# Patient Record
Sex: Male | Born: 1965 | Marital: Single | Smoking: Never smoker
Health system: Southern US, Community
[De-identification: ages and names within clinical notes are randomized; demographics above are authoritative.]

---

## 2016-04-16 ENCOUNTER — Encounter: Payer: Self-pay | Admitting: Sports Medicine

## 2016-04-16 ENCOUNTER — Ambulatory Visit (INDEPENDENT_AMBULATORY_CARE_PROVIDER_SITE_OTHER): Payer: No Typology Code available for payment source | Admitting: Sports Medicine

## 2016-04-16 DIAGNOSIS — M2142 Flat foot [pes planus] (acquired), left foot: Secondary | ICD-10-CM | POA: Diagnosis not present

## 2016-04-16 DIAGNOSIS — M79671 Pain in right foot: Secondary | ICD-10-CM | POA: Diagnosis not present

## 2016-04-16 DIAGNOSIS — M2141 Flat foot [pes planus] (acquired), right foot: Secondary | ICD-10-CM | POA: Diagnosis not present

## 2016-04-16 DIAGNOSIS — M729 Fibroblastic disorder, unspecified: Secondary | ICD-10-CM | POA: Diagnosis not present

## 2016-04-16 DIAGNOSIS — M79672 Pain in left foot: Secondary | ICD-10-CM | POA: Diagnosis not present

## 2016-04-16 DIAGNOSIS — M21619 Bunion of unspecified foot: Secondary | ICD-10-CM

## 2016-04-16 DIAGNOSIS — M779 Enthesopathy, unspecified: Secondary | ICD-10-CM | POA: Diagnosis not present

## 2016-04-16 NOTE — Progress Notes (Signed)
Subjective: Bobby Herrera is a 50 y.o. male patient who presents to office for evaluation for orthotics; Patient denies current pain but in past had knee and hip issues and was given orthotics in the past for fasciitis and flatfeet. Patient desires new orthotics current ones are old and worn and no longer offers much support. Patient is an avid runner 27 miles per week. Patient denies any other pedal complaints.   There are no active problems to display for this patient.   No current outpatient prescriptions on file prior to visit.   No current facility-administered medications on file prior to visit.     No Known Allergies   Objective:  General: Alert and oriented x3 in no acute distress  Dermatology: No open lesions bilateral lower extremities, no webspace macerations, no ecchymosis bilateral, all nails x 10 are well manicured.  Vascular: Dorsalis Pedis and Posterior Tibial pedal pulses 2/4, Capillary Fill Time 3 seconds, (+) pedal hair growth bilateral, no edema bilateral lower extremities, Temperature gradient within normal limits.  Neurology: Michaell CowingGross sensation intact via light touch bilateral.  Musculoskeletal: No reproducible tenderness with palpation along medial arch, medial fascial band on or along Posterior tibial tendon course with medial soft tissue buldge noted, there is decreased ankle rom with knee extending  vs flexed resembling gastroc equnius bilateral, Subtalar joint range of motion is within normal limits, there is no 1st ray hypermobility noted bilateral, decreased 1st MPJ rom Right>Left with functional limitus and bunion noted on weightbearing exam, there is medial arch collapse Right> Left on weightbearing exam,slight RF valgus Right> Left, no "too-many toes" sign appreciated, able to perform heel rise test with no pain in right>left arch.   Current orthotics worn and no longer offering full biomechanical support  Biomechanical exam: See below       Assessment  and Plan: Problem List Items Addressed This Visit    None    Visit Diagnoses    Fasciitis    -  Primary   Pes planus of both feet       Tendonitis       Bunion       Foot pain, bilateral         -Complete  examination performed -Discussed treatement options; discussed bunion/ fasciitis/tendonitis/pes planus deformity;conservative and  Surgical management; risks, benefits, alternatives discussed. All questions answered.  -Rx [facility] orthotics [place] lab full length polypro firm, pelite, deep heel cup -Recommend good supportive shoes and replacing running shoes 300-500 miles -Patient to return to office for orthotic dispensing or sooner if condition worsens.  Asencion Islamitorya Naren Benally, DPM   BIOMECHANICAL EXAMINATION FORM  MUSCULOSKELETAL EVALUATION  Normal Non-Wt Bearing Assessment R  L  Quality of Motion (circle) R  L  Muscle Strength (0-5/5) R  L  45o Internal Hip Rotation (ext)  normal Ankle (dorsiflexion)  Hip Flexors  45o External Hip Rotation (ext) normal Normal Limited Painful Hip Extensors  0o Neutral Position of Hip (ext) normal Ankle (plantarflexion)  Hip Abductors  15-20o Malleolar Position (ext) normal  Normal Limited Painful Hip Adductors  10o Ankle DF (Knee Extended) 0 STJ (supination) Hip Rotators (Internal)   >10o Ankle DF (Knee Flexed) +2  Normal Limited Painful Hip Rotators (External)   20o Heel Inversion 15 STJ (pronation) Gastrocnemius  10o Heel Eversion 15 Normal Limited Painful Soleus  0o STJ Neutral Position 2 everted Hallux (dorsiflexion) Tib. Posterior  perp Forefoot to Rearfoot (1-5) perp Normal Limited Painful Flex. Hallucis Longus  perp Forefoot to Rearfoot (2-5) perp Hallux (  plantarflexion) Flex. Digitorum Longus  5mm First Ray Dorsiflexion normal Normal Limited Painful Flex. Digitorum Brevis  5mm First Ray Plantarflexion normal Lesser Digits (dorsiflexion) Tib. Anterior  0mm First Ray Neutral Position normal Normal Limited Painful Ext. Digitorum Longus  65o  Hallux Dorsiflexion 55 Lesser Digits (plantarflexion) Ext. Hallucis Longus  >30o Hallux Plantarflexion 30 Normal Limited Painful Ext. Digitorum Brevis  Peroneus Longus  FOOT MORPHOLOGY  Peroneus Brevis  Frontal Plane (circle) Sagittal Plane R L   Normal morphology R L Normal morphology  Varus Valgus Anterior Cavus Transverse Plane R L  Forefoot R L R L Posterior Cavus Normal morphology  Rearfoot R L R L Cavoadductovarus Forefoot Adducted  Calcaneovalgus R>L 4 deg evered  Forefoot Abducted  ANKLE MORPHOLOGY R  L  Planovalgus Rearfoot Adducted  Normal morphology Rocker Bottom  Equinus Bilateral  Other________________  Calcaneus DIGITAL ASSESSMENT (circle)  Varum Limb Length Inequality (in cm) None R L R: 1 2 3 4 5  L: 1 2 3 4 5  Abducted   Valgum 4 Normal (symmetric) R: 1 2 3 4 5  L: 1 2 3 4 5  Adducted   Other________________ Structural R: 1 2 3 4 5  L: 1 2 3 4 5  Claw toe   Combined R: 1 2 3 4 5  L: 1 2 3 4 5  Hammer toe   POSTURAL APPRAISAL (circle) Functional Mallet toe   Head Position:  Hallux IP Extensus R L   Forward Backward Sideward GAIT ANALYSIS (Barefoot Gait Pattern)  Hallux IP Abductus R L  Shoulders: (circle)   Level R L Normal Antalgic Apropulsive  IF A PORTION OF EXAM IS DEFERRED,   Dropped  Other (e.g. Steppage, Circumducted, Scissor) GIVE REASON:   Forward  R L   Backward  Angle of Gait  Spine: None Base of Gait   Scoliosis   Lordosis  Patellar Position:   Kyphosis  Contact ASSESSMENT:  Pelvis:  Level Mid-Stance   Level  Propulsion   Dropped  Swing   Forward   Backward  Heel Position:   Contact  Patella Orientation (circle):  central Mid-Stance  Medial Central Lateral  Propulsion  R L R L R L  Swing  Knee Varum Valgum Flexion Recurvatum  R L R L R L R L Heel Off (circle): Increased  TREATMENT PLAN:  Tibia frontal  Varum Valgum  WNL Early  R L R L  Abductory Twist (circle):  none Malleolar Position Internal External  R L R L  Yes No; NO  R L  Neutral Calc. Stance  Position (deg.)  0 deg  Relaxed Calc. Stance Position (deg.) 2 deg

## 2016-05-07 ENCOUNTER — Ambulatory Visit (INDEPENDENT_AMBULATORY_CARE_PROVIDER_SITE_OTHER): Payer: No Typology Code available for payment source

## 2016-05-07 DIAGNOSIS — M779 Enthesopathy, unspecified: Secondary | ICD-10-CM

## 2016-05-07 DIAGNOSIS — M729 Fibroblastic disorder, unspecified: Secondary | ICD-10-CM

## 2016-05-07 NOTE — Patient Instructions (Signed)

## 2016-05-07 NOTE — Progress Notes (Signed)
Patient presents for orthotic pick up.  Verbal and written break in and wear instructions given.  Patient will follow up in 4 weeks if symptoms worsen or fail to improve. 

## 2020-07-12 ENCOUNTER — Encounter (HOSPITAL_BASED_OUTPATIENT_CLINIC_OR_DEPARTMENT_OTHER): Payer: Self-pay

## 2020-07-12 ENCOUNTER — Other Ambulatory Visit: Payer: Self-pay

## 2020-07-12 DIAGNOSIS — M7989 Other specified soft tissue disorders: Secondary | ICD-10-CM | POA: Diagnosis present

## 2020-07-12 DIAGNOSIS — I82401 Acute embolism and thrombosis of unspecified deep veins of right lower extremity: Secondary | ICD-10-CM | POA: Insufficient documentation

## 2020-07-12 DIAGNOSIS — R0989 Other specified symptoms and signs involving the circulatory and respiratory systems: Secondary | ICD-10-CM | POA: Diagnosis not present

## 2020-07-12 NOTE — ED Triage Notes (Addendum)
Pt c/o right LE swelling from knee to foot-denies injury-NAD-steady gait-sent from [place] walk in clinic with notes for need for [place]

## 2020-07-12 NOTE — ED Notes (Signed)
Explained to pt no [place] available at this time and that he will need to wait to see EDP for possibility of order for outpt [place] schedule for tomorrow-agreeable

## 2020-07-13 ENCOUNTER — Emergency Department (HOSPITAL_BASED_OUTPATIENT_CLINIC_OR_DEPARTMENT_OTHER)
Admit: 2020-07-13 | Discharge: 2020-07-13 | Disposition: A | Discharge status: Home / Self Care | Payer: No Typology Code available for payment source | Attending: Emergency Medicine | Admitting: Emergency Medicine

## 2020-07-13 ENCOUNTER — Emergency Department (HOSPITAL_BASED_OUTPATIENT_CLINIC_OR_DEPARTMENT_OTHER)
Admission: EM | Admit: 2020-07-13 | Discharge: 2020-07-13 | Disposition: A | Discharge status: Home / Self Care | Payer: No Typology Code available for payment source | Source: Home / Self Care | Attending: Emergency Medicine | Admitting: Emergency Medicine

## 2020-07-13 ENCOUNTER — Encounter (HOSPITAL_BASED_OUTPATIENT_CLINIC_OR_DEPARTMENT_OTHER): Payer: Self-pay

## 2020-07-13 ENCOUNTER — Emergency Department (HOSPITAL_BASED_OUTPATIENT_CLINIC_OR_DEPARTMENT_OTHER)
Admission: EM | Admit: 2020-07-13 | Discharge: 2020-07-13 | Disposition: A | Discharge status: Home / Self Care | Payer: No Typology Code available for payment source | Attending: Emergency Medicine | Admitting: Emergency Medicine

## 2020-07-13 ENCOUNTER — Other Ambulatory Visit: Payer: Self-pay

## 2020-07-13 DIAGNOSIS — I82401 Acute embolism and thrombosis of unspecified deep veins of right lower extremity: Secondary | ICD-10-CM | POA: Insufficient documentation

## 2020-07-13 DIAGNOSIS — R0989 Other specified symptoms and signs involving the circulatory and respiratory systems: Secondary | ICD-10-CM

## 2020-07-13 DIAGNOSIS — I824Y1 Acute embolism and thrombosis of unspecified deep veins of right proximal lower extremity: Secondary | ICD-10-CM

## 2020-07-13 LAB — BASIC METABOLIC PANEL
Anion gap: 11 (ref 5–15)
BUN: 15 mg/dL (ref 6–20)
CO2: 25 mmol/L (ref 22–32)
Calcium: 9.1 mg/dL (ref 8.9–10.3)
Chloride: 99 mmol/L (ref 98–111)
Creatinine, Ser: 0.88 mg/dL (ref 0.61–1.24)
GFR, Estimated: >60 mL/min (ref >60)
Glucose, Bld: 96 mg/dL (ref 70–99)
Potassium: 3.7 mmol/L (ref 3.5–5.1)
Sodium: 135 mmol/L (ref 135–145)

## 2020-07-13 LAB — CBC WITH DIFFERENTIAL/PLATELET
Abs Immature Granulocytes: 0.01 10*3/uL (ref 0.00–0.07)
Basophils Absolute: 0 10*3/uL (ref 0.0–0.1)
Basophils Relative: 0 %
Eosinophils Absolute: 0.1 10*3/uL (ref 0.0–0.5)
Eosinophils Relative: 2 %
HCT: 42.7 % (ref 39.0–52.0)
Hemoglobin: 13.9 g/dL (ref 13.0–17.0)
Immature Granulocytes: 0 %
Lymphocytes Relative: 28 %
Lymphs Abs: 1.3 10*3/uL (ref 0.7–4.0)
MCH: 30 pg (ref 26.0–34.0)
MCHC: 32.6 g/dL (ref 30.0–36.0)
MCV: 92.2 fL (ref 80.0–100.0)
Monocytes Absolute: 0.6 10*3/uL (ref 0.1–1.0)
Monocytes Relative: 11 %
Neutro Abs: 2.9 10*3/uL (ref 1.7–7.7)
Neutrophils Relative %: 59 %
Platelets: 122 10*3/uL — ABNORMAL LOW (ref 150–400)
RBC: 4.63 MIL/uL (ref 4.22–5.81)
RDW: 13.4 % (ref 11.5–15.5)
WBC: 4.9 10*3/uL (ref 4.0–10.5)
nRBC: 0 % (ref 0.0–0.2)

## 2020-07-13 MED ORDER — APIXABAN (ELIQUIS) VTE STARTER PACK (10MG AND 5MG): ORAL_TABLET | ORAL | 0 refills | Status: DC

## 2020-07-13 MED ORDER — ENOXAPARIN SODIUM 80 MG/0.8ML ~~LOC~~ SOLN
1.0000 mg/kg | Freq: Once | SUBCUTANEOUS | Status: AC
  Administered 2020-07-13: 75 mg via SUBCUTANEOUS
  Filled 2020-07-13: qty 0.8

## 2020-07-13 NOTE — ED Triage Notes (Signed)
Pt returned for outpt [place] with +DVT right LE-NAD-stead gait

## 2020-07-13 NOTE — ED Notes (Signed)
Scheduled pt for [place] today @ 1300, pt verbalized understanding to return to [facility] around this time for exam. Vitals WNL, ambulated out of ED w/o issue.

## 2020-07-13 NOTE — ED Provider Notes (Signed)
[facility] EMERGENCY DEPARTMENT Provider Note  CSN: [id number] Arrival date & time: 07/13/20 1403    History No chief complaint on file.   HPI  Bobby Herrera is a 55 y.o. male seen yesterday for RLE swelling. Returned today for outpatient doppler which was positive for DVT. Has not had any recent labs in our system or at PCP office. Denies any CP or SOB.    History reviewed. No pertinent past medical history.  History reviewed. No pertinent surgical history.  No family history on file.  Social History   Tobacco Use  . Smoking status: Never Smoker  . Smokeless tobacco: Never Used  Substance Use Topics  . Alcohol use: Never  . Drug use: Never     Home Medications Prior to Admission medications   Not on File     Allergies    Patient has no known allergies.   Review of Systems   Review of Systems A comprehensive review of systems was completed and negative except as noted in HPI.     Physical Exam BP (!) 153/105 (BP Location: Right Arm)   Pulse 67   Temp 98.4 F (36.9 C) (Oral)   Resp 18   Wt 76.2 kg   SpO2 100%   BMI 22.78 kg/m   Physical Exam Vitals and nursing note reviewed.  HENT:     Head: Normocephalic.     Nose: Nose normal.  Eyes:     Extraocular Movements: Extraocular movements intact.  Pulmonary:     Effort: Pulmonary effort is normal.  Musculoskeletal:        General: Normal range of motion.     Cervical back: Neck supple.     Right lower leg: Edema present.     Left lower leg: No edema.  Skin:    Findings: No rash (on exposed skin).  Neurological:     Mental Status: He is alert and oriented to person, place, and time.  Psychiatric:        Mood and Affect: Mood normal.      ED Results / Procedures / Treatments   Labs (all labs ordered are listed, but only abnormal results are displayed) Labs Reviewed  BASIC METABOLIC PANEL  CBC WITH DIFFERENTIAL/PLATELET    EKG None   Radiology US Venous Img Lower  Unilateral Right  Result Date: 07/13/2020 CLINICAL DATA:  Right lower extremity pain and swelling for 3 days. EXAM: RIGHT LOWER EXTREMITY VENOUS DOPPLER ULTRASOUND TECHNIQUE: Gray-scale sonography with compression, as well as color and duplex ultrasound, were performed to evaluate the deep venous system(s) from the level of the common femoral vein through the popliteal and proximal calf veins. COMPARISON:  None. FINDINGS: VENOUS Occlusive thrombus is seen in the right common femoral and profundus femoral veins as well as at the saphenofemoral junction. Limited views of the contralateral common femoral vein are unremarkable. OTHER None. Limitations: None. IMPRESSION: Positive for acute DVT as described above. Critical Value/emergent results were called by telephone at the time of interpretation on 07/13/2020 at 1:30 pm to provider [facility] , who verbally acknowledged these results. Electronically Signed   By: Drusilla Kanner M.D.   On: 07/13/2020 13:35    Procedures Procedures  Medications Ordered in the ED Medications - No data to display   MDM Rules/Calculators/A&P MDM Doppler is positive for DVT. Unfortunately no labs available to ensure creatinine clearance is sufficient for oral anticoagulation. Will check labs and plan discharge if unremarkable.  ED Course  I have  reviewed the triage vital signs and the nursing notes.  Pertinent labs & imaging results that were available during my care of the patient were reviewed by me and considered in my medical decision making (see chart for details).     Final Clinical Impression(s) / ED Diagnoses Final diagnoses:  Acute deep vein thrombosis (DVT) of proximal vein of right lower extremity [facility])    Rx / DC Orders ED Discharge Orders    None       Pollyann Savoy, MD 07/13/20 1457

## 2020-07-13 NOTE — ED Provider Notes (Signed)
   [facility] Provider Note: Lowella Dell, MD, FACEP  CSN: [id number] MRN: [id number] ARRIVAL: 07/12/20 at 2157 ROOM: MH08/MH08   CHIEF COMPLAINT  Leg Swelling   HISTORY OF PRESENT ILLNESS  07/13/20 2:33 AM Bobby Herrera is a 55 y.o. male with 3 days of swelling and discomfort in his right lower leg from the knee down.  He is not aware of anything that triggered this.  He has not been on recent travel.  He has no known malignancy.  Symptoms are moderate. He denies chest pain or shortness of breath.  He was seen in an [place] walk-in clinic yesterday and they advised to come to the ED for a Doppler ultrasound.   History reviewed. No pertinent past medical history.  History reviewed. No pertinent surgical history.  No family history on file.  Social History   Tobacco Use  . Smoking status: Never Smoker  . Smokeless tobacco: Never Used  Substance Use Topics  . Alcohol use: Never  . Drug use: Never    Prior to Admission medications   Not on File    Allergies Patient has no known allergies.   REVIEW OF SYSTEMS  Negative except as noted here or in the History of Present Illness.   PHYSICAL EXAMINATION  Initial Vital Signs Blood pressure 125/78, pulse (!) 56, temperature 98.8 F (37.1 C), temperature source Oral, resp. rate 18, height 6' (1.829 m), weight 74.8 kg, SpO2 100 %.  Examination General: Well-developed, well-nourished male in no acute distress; appearance consistent with age of record HENT: normocephalic; atraumatic Eyes: Normal appearance Neck: supple Heart: regular rate and rhythm Lungs: clear to auscultation bilaterally Abdomen: soft; nondistended; nontender; bowel sounds present Extremities: No deformity; full range of motion; edema and mild tenderness of right lower leg without erythema or warmth Neurologic: Awake, alert and oriented; motor function intact in all extremities and symmetric; no facial droop Skin: Warm and dry Psychiatric:  Normal mood and affect   RESULTS  Summary of this visit's results, reviewed and interpreted by myself:   EKG Interpretation  Date/Time:    Ventricular Rate:    PR Interval:    QRS Duration:   QT Interval:    QTC Calculation:   R Axis:     Text Interpretation:        Laboratory Studies: No results found for this or any previous visit (from the past 24 hour(s)). Imaging Studies: No results found.  ED COURSE and MDM  Nursing notes, initial and subsequent vitals signs, including pulse oximetry, reviewed and interpreted by myself.  Vitals:   07/12/20 2212 07/13/20 0030  BP: (!) 147/92 125/78  Pulse: 66 (!) 56  Resp: 18 18  Temp: 98.4 F (36.9 C) 98.8 F (37.1 C)  TempSrc: Oral Oral  SpO2: 100% 100%  Weight: 74.8 kg   Height: 6' (1.829 m)    Medications  enoxaparin (LOVENOX) injection 75 mg (has no administration in time range)    Patient's symptoms and presentation are concerning for deep vein thrombosis of the right leg.  We will give Lovenox and have him return later today for Doppler ultrasound.  PROCEDURES  Procedures   ED DIAGNOSES     ICD-10-CM   1. Suspected DVT (deep vein thrombosis)  R09.89        Paula Libra, MD 07/13/20 [phone]

## 2020-07-25 ENCOUNTER — Telehealth: Payer: Self-pay | Admitting: Hematology and Oncology

## 2020-07-25 NOTE — Telephone Encounter (Signed)
Received a new hem referral from Dr. Kateri Plummer for dvt. Mr. Bobby Herrera has been cld and scheduled to see Dr. Leonides Schanz on 2/4 at 2pm. Pt aware to arrive 20 minutes early.

## 2020-08-04 ENCOUNTER — Inpatient Hospital Stay: Payer: No Typology Code available for payment source | Attending: Hematology and Oncology

## 2020-08-04 ENCOUNTER — Other Ambulatory Visit: Payer: Self-pay

## 2020-08-04 ENCOUNTER — Inpatient Hospital Stay (HOSPITAL_BASED_OUTPATIENT_CLINIC_OR_DEPARTMENT_OTHER): Payer: No Typology Code available for payment source | Admitting: Hematology and Oncology

## 2020-08-04 VITALS — BP 124/86 | HR 71 | Temp 97.6°F | Resp 18 | Ht 72.0 in | Wt 170.2 lb

## 2020-08-04 DIAGNOSIS — Z7901 Long term (current) use of anticoagulants: Secondary | ICD-10-CM | POA: Diagnosis not present

## 2020-08-04 DIAGNOSIS — I82411 Acute embolism and thrombosis of right femoral vein: Secondary | ICD-10-CM | POA: Insufficient documentation

## 2020-08-04 LAB — CBC WITH DIFFERENTIAL (CANCER CENTER ONLY)
Abs Immature Granulocytes: 0 10*3/uL (ref 0.00–0.07)
Basophils Absolute: 0 10*3/uL (ref 0.0–0.1)
Basophils Relative: 0 %
Eosinophils Absolute: 0.1 10*3/uL (ref 0.0–0.5)
Eosinophils Relative: 2 %
HCT: 42.2 % (ref 39.0–52.0)
Hemoglobin: 13.6 g/dL (ref 13.0–17.0)
Immature Granulocytes: 0 %
Lymphocytes Relative: 48 %
Lymphs Abs: 1.8 10*3/uL (ref 0.7–4.0)
MCH: 29.6 pg (ref 26.0–34.0)
MCHC: 32.2 g/dL (ref 30.0–36.0)
MCV: 91.9 fL (ref 80.0–100.0)
Monocytes Absolute: 0.4 10*3/uL (ref 0.1–1.0)
Monocytes Relative: 11 %
Neutro Abs: 1.4 10*3/uL — ABNORMAL LOW (ref 1.7–7.7)
Neutrophils Relative %: 39 %
Platelet Count: 154 10*3/uL (ref 150–400)
RBC: 4.59 MIL/uL (ref 4.22–5.81)
RDW: 13.5 % (ref 11.5–15.5)
WBC Count: 3.7 10*3/uL — ABNORMAL LOW (ref 4.0–10.5)
nRBC: 0 % (ref 0.0–0.2)

## 2020-08-04 LAB — CMP (CANCER CENTER ONLY)
ALT: 20 U/L (ref 0–44)
AST: 21 U/L (ref 15–41)
Albumin: 4 g/dL (ref 3.5–5.0)
Alkaline Phosphatase: 84 U/L (ref 38–126)
Anion gap: 7 (ref 5–15)
BUN: 12 mg/dL (ref 6–20)
CO2: 27 mmol/L (ref 22–32)
Calcium: 9.3 mg/dL (ref 8.9–10.3)
Chloride: 104 mmol/L (ref 98–111)
Creatinine: 1.23 mg/dL (ref 0.61–1.24)
GFR, Estimated: >60 mL/min (ref >60)
Glucose, Bld: 97 mg/dL (ref 70–99)
Potassium: 4.5 mmol/L (ref 3.5–5.1)
Sodium: 138 mmol/L (ref 135–145)
Total Bilirubin: 0.4 mg/dL (ref 0.3–1.2)
Total Protein: 7.5 g/dL (ref 6.5–8.1)

## 2020-08-04 NOTE — Progress Notes (Signed)
[facility] [phone]   [phone]  INITIAL CONSULT NOTE  Patient Care Team: Patient, No Pcp Per as PCP - General (General Practice)  Hematological/Oncological History # Unprovoked DVT of Right Leg 07/13/2020: presented to ED with pain/swelling in RLE. [place] LE showed occlusive thrombus is seen in the right common femoral and profundus femoral veins as well as at the saphenofemoral junction. Started on Eliquis therapy 5mg  BID.  08/04/2020: establish care with Dr. 10/02/2020   CHIEF COMPLAINTS/PURPOSE OF CONSULTATION:  "DVT of Right Leg "  HISTORY OF PRESENTING ILLNESS:  Bobby Herrera 55 y.o. male with no remarkable medical history who presents for evaluation of an unprovoked right lower extremity DVT.  On review of the previous records Bobby Herrera presented the emergency department on 07/13/2020 with pain and swelling in his right lower extremity.  He had an ultrasound performed which showed an occlusive thrombus in the right common femoral and profunda's femoral veins as well as the saphenofemoral junction.  He started on Eliquis therapy 5 mg twice daily.  Due to concern for this patient's unprovoked DVT he was referred to hematology for further evaluation and management.  On exam today Bobby Herrera notes that his symptoms originally began 3 to 4 days before he presented to an urgent care around January 12.  When they suspected DVT they sent him to the emergency department where he had the ultrasound performed which confirmed the diagnosis.  He reports he has been tolerating Eliquis therapy well and is not been having any issues with bleeding, bruising, or dark stools.  He also notes that there has been a decrease in the swelling and decrease in pain in the lower extremity that was affected by the DVT.  On further discussion he notes that he has not had any provoking factors to cause this DVT such as long car ride, prolonged immobilization, or recent surgery.  He  notes that he does not keep up with his hydration, and thinks dehydration may have been a contributing factor.  In regards to his family history he reports that his father had a sudden death of unclear etiology, though pulmonary embolism was on the differential.  No autopsy was performed.  He reports that his mother is healthy and he does not have any children.  He currently works for the [employer]  He otherwise denies any fevers, chills, sweats, nausea, vomiting or diarrhea.  He notes that his cancer screenings are currently up-to-date.  A full 10 point ROS is listed below.  MEDICAL HISTORY:  History reviewed. No pertinent past medical history.  SURGICAL HISTORY: History reviewed. No pertinent surgical history.  SOCIAL HISTORY: Social History   Socioeconomic History  . Marital status: Single    Spouse name: Not on file  . Number of children: 0  . Years of education: Not on file  . Highest education level: Not on file  Occupational History  . Not on file  Tobacco Use  . Smoking status: Never Smoker  . Smokeless tobacco: Never Used  Substance and Sexual Activity  . Alcohol use: Never  . Drug use: Never  . Sexual activity: Not on file  Other Topics Concern  . Not on file  Social History Narrative  . Not on file   Social Determinants of Health   Financial Resource Strain: Not on file  Food Insecurity: Not on file  Transportation Needs: Not on file  Physical Activity: Not on file  Stress: Not on file  Social Connections:  Not on file  Intimate Partner Violence: Not on file    FAMILY HISTORY: Family History  Problem Relation Age of Onset  . Healthy Mother   . Pulmonary embolism Father     ALLERGIES:  has No Known Allergies.  MEDICATIONS:  Current Outpatient Medications  Medication Sig Dispense Refill  . apixaban (ELIQUIS) 5 MG TABS tablet Take 1 tablet (5 mg total) by mouth 2 (two) times daily. 60 tablet 3   No current facility-administered medications  for this visit.    REVIEW OF SYSTEMS:   Constitutional: ( - ) fevers, ( - )  chills , ( - ) night sweats Eyes: ( - ) blurriness of vision, ( - ) double vision, ( - ) watery eyes Ears, nose, mouth, throat, and face: ( - ) mucositis, ( - ) sore throat Respiratory: ( - ) cough, ( - ) dyspnea, ( - ) wheezes Cardiovascular: ( - ) palpitation, ( - ) chest discomfort, ( - ) lower extremity swelling Gastrointestinal:  ( - ) nausea, ( - ) heartburn, ( - ) change in bowel habits Skin: ( - ) abnormal skin rashes Lymphatics: ( - ) new lymphadenopathy, ( - ) easy bruising Neurological: ( - ) numbness, ( - ) tingling, ( - ) new weaknesses Behavioral/Psych: ( - ) mood change, ( - ) new changes  All other systems were reviewed with the patient and are negative.  PHYSICAL EXAMINATION:  Vitals:   08/04/20 1354  BP: 124/86  Pulse: 71  Resp: 18  Temp: 97.6 F (36.4 C)  SpO2: 99%   Filed Weights   08/04/20 1354  Weight: 170 lb 3.2 oz (77.2 kg)    GENERAL: well appearing middle aged [place] American male in NAD  SKIN: skin color, texture, turgor are normal, no rashes or significant lesions EYES: conjunctiva are pink and non-injected, sclera clear LUNGS: clear to auscultation and percussion with normal breathing effort HEART: regular rate & rhythm and no murmurs and no lower extremity edema Musculoskeletal: no cyanosis of digits and no clubbing  PSYCH: alert & oriented x 3, fluent speech NEURO: no focal motor/sensory deficits  LABORATORY DATA:  I have reviewed the data as listed CBC Latest Ref Rng & Units 08/04/2020 07/13/2020  WBC 4.0 - 10.5 K/uL 3.7(L) 4.9  Hemoglobin 13.0 - 17.0 g/dL 82.5 00.3  Hematocrit 70.4 - 52.0 % 42.2 42.7  Platelets 150 - 400 K/uL 154 122(L)    CMP Latest Ref Rng & Units 08/04/2020 07/13/2020  Glucose 70 - 99 mg/dL 97 96  BUN 6 - 20 mg/dL 12 15  Creatinine 8.88 - 1.24 mg/dL 9.16 9.45  Sodium 038 - 145 mmol/L 138 135  Potassium 3.5 - 5.1 mmol/L 4.5 3.7  Chloride  98 - 111 mmol/L 104 99  CO2 22 - 32 mmol/L 27 25  Calcium 8.9 - 10.3 mg/dL 9.3 9.1  Total Protein 6.5 - 8.1 g/dL 7.5 -  Total Bilirubin 0.3 - 1.2 mg/dL 0.4 -  Alkaline Phos 38 - 126 U/L 84 -  AST 15 - 41 U/L 21 -  ALT 0 - 44 U/L 20 -    RADIOGRAPHIC STUDIES: US Venous Img Lower Unilateral Right  Result Date: 07/13/2020 CLINICAL DATA:  Right lower extremity pain and swelling for 3 days. EXAM: RIGHT LOWER EXTREMITY VENOUS DOPPLER ULTRASOUND TECHNIQUE: Gray-scale sonography with compression, as well as color and duplex ultrasound, were performed to evaluate the deep venous system(s) from the level of the common femoral vein through the  popliteal and proximal calf veins. COMPARISON:  None. FINDINGS: VENOUS Occlusive thrombus is seen in the right common femoral and profundus femoral veins as well as at the saphenofemoral junction. Limited views of the contralateral common femoral vein are unremarkable. OTHER None. Limitations: None. IMPRESSION: Positive for acute DVT as described above. Critical Value/emergent results were called by telephone at the time of interpretation on 07/13/2020 at 1:30 pm to provider [facility] , who verbally acknowledged these results. Electronically Signed   By: Drusilla Kanner M.D.   On: 07/13/2020 13:35    ASSESSMENT & PLAN Bobby Herrera 55 y.o. male with no remarkable medical history who presents for evaluation of an unprovoked right lower extremity DVT.  I reviewed labs, reviewed records, schedule the patient the findings most consistent with an unprovoked right lower extremity DVT.  Given these findings I would recommend that we proceed with indefinite anticoagulation.  He is tolerating Eliquis therapy well and I think this is a reasonable option for him.  Today we will order baseline labs in order to assure that he is appropriate kidney function and liver function for this therapy.  Additionally we will order antiphospholipid antibody in order to assure that he is  on the appropriate anticoagulation therapy.  We will plan to see him back in approximate 3 months time for continued discussion regarding anticoagulation therapy.  # Unprovoked DVT of Right Leg --Agree with continued Eliquis 5 mg twice daily.  The duration of therapy is indefinite given the unprovoked nature of his DVT. --We will order baseline CBC and CMP today. --We will order antiphospholipid antibodies in order to assure that the patient is on the correct anticoagulant therapy.  If he does have antiphospholipid antibody syndrome he would require Coumadin therapy instead of Eliquis.  Any other hypercoagulable condition would not alter management. --After 6 months of therapy can consider de-escalation down to maintenance dose Eliquis at 2.5 mg twice daily.  If there are financial issues or if the patient does not wish to continue anticoagulation therapy we could consider aspirin 81 mg daily instead. --Return to clinic in 3 months time in order to reevaluate.  Orders Placed This Encounter  Procedures  . CBC with Differential (Cancer Center Only)    Standing Status:   Future    Number of Occurrences:   1    Standing Expiration Date:   08/04/2021  . CMP (Cancer Center only)    Standing Status:   Future    Number of Occurrences:   1    Standing Expiration Date:   08/04/2021  . Cardiolipin antibodies, IgG, IgM, IgA*    Standing Status:   Future    Number of Occurrences:   1    Standing Expiration Date:   08/04/2021  . Beta-2-glycoprotein i abs, IgG/M/A    Standing Status:   Future    Number of Occurrences:   1    Standing Expiration Date:   08/04/2021  . Lupus anticoagulant panel*    Standing Status:   Future    Number of Occurrences:   1    Standing Expiration Date:   08/04/2021    All questions were answered. The patient knows to call the clinic with any problems, questions or concerns.  A total of more than 60 minutes were spent on this encounter and over half of that time was spent on  counseling and coordination of care as outlined above.   Ulysees Barns, MD Department of Hematology/Oncology [facility] at  [facility] Phone: [phone] Pager: [phone] Email: [email or web address]  08/06/2020 5:01 PM

## 2020-08-05 LAB — LUPUS ANTICOAGULANT PANEL
DRVVT: 48 s — ABNORMAL HIGH (ref 0.0–47.0)
PTT Lupus Anticoagulant: 32 s (ref 0.0–51.9)

## 2020-08-05 LAB — BETA-2-GLYCOPROTEIN I ABS, IGG/M/A
Beta-2 Glyco I IgG: <9 GPI IgG units (ref 0–20)
Beta-2-Glycoprotein I IgA: <9 GPI IgA units (ref 0–25)
Beta-2-Glycoprotein I IgM: <9 GPI IgM units (ref 0–32)

## 2020-08-05 LAB — CARDIOLIPIN ANTIBODIES, IGG, IGM, IGA
Anticardiolipin IgA: <9 APL U/mL (ref 0–11)
Anticardiolipin IgG: <9 GPL U/mL (ref 0–14)
Anticardiolipin IgM: 11 MPL U/mL (ref 0–12)

## 2020-08-05 LAB — DRVVT MIX: dRVVT Mix: 41.4 s — ABNORMAL HIGH (ref 0.0–40.4)

## 2020-08-05 LAB — DRVVT CONFIRM: dRVVT Confirm: 1.2 ratio (ref 0.8–1.2)

## 2020-08-06 ENCOUNTER — Encounter: Payer: Self-pay | Admitting: Hematology and Oncology

## 2020-08-06 MED ORDER — APIXABAN 5 MG PO TABS: 5.0000 mg | ORAL_TABLET | Freq: Two times a day (BID) | ORAL | 3 refills | Status: DC

## 2020-08-07 ENCOUNTER — Telehealth: Payer: Self-pay | Admitting: Hematology and Oncology

## 2020-08-07 NOTE — Telephone Encounter (Signed)
Scheduled appointments per 2/7 los. Called patient, no answer and no voicemail. Mailed updated calendar to patient.

## 2020-08-16 ENCOUNTER — Telehealth: Payer: Self-pay | Admitting: *Deleted

## 2020-08-16 NOTE — Telephone Encounter (Signed)
TCT patient. No answer and no available vmailbox.

## 2020-08-16 NOTE — Telephone Encounter (Signed)
-----   Message from Jaci Standard, MD sent at 08/16/2020  8:20 AM EST ----- Please tell Bobby Herrera that his bloodwork showed no evidence of a blood clotting disorder. It is unclear what caused his blood clot. We will plan to follow up with him in approximately 3 months.  ----- Message ----- From: Interface, [facility] In [place] Sent: 08/04/2020   2:55 PM EST To: Jaci Standard, MD

## 2020-09-06 ENCOUNTER — Telehealth: Payer: Self-pay | Admitting: *Deleted

## 2020-09-06 NOTE — Telephone Encounter (Signed)
Received call from patient inquiring about his lab results from 08/04/20. Please review and advise.  Pt was also asking about a "risk assessment for DVT"  Is this something you are familiar with? Please advise

## 2020-09-08 ENCOUNTER — Telehealth: Payer: Self-pay | Admitting: Hematology and Oncology

## 2020-09-08 NOTE — Telephone Encounter (Signed)
Returned call to reschedule upcoming appointment per 3/9 schedule message. Left message to call back to reschedule.

## 2020-09-13 NOTE — Telephone Encounter (Signed)
Please let Bobby Herrera know that we found no evidence of a clotting disorder in our focused workup. Risk Assessment for DVT is used to determine when to ultrasound the leg when there is suspicion of a clot. Since he has a clot already this assessment does not apply to him.

## 2020-09-13 NOTE — Telephone Encounter (Signed)
TCT patient regarding lab results. No answer but was able to leave vm message for pt to call back to review his lab results.

## 2020-10-02 ENCOUNTER — Telehealth: Payer: Self-pay | Admitting: *Deleted

## 2020-10-02 NOTE — Telephone Encounter (Signed)
Received call from patient regarding his lab results.  Please advise

## 2020-10-02 NOTE — Telephone Encounter (Signed)
TCT patient after speaking with Dr. Leonides Schanz. Spoke with patient and advised that his labs did not reveal any clotting disorders.  Pt voiced understanding

## 2020-10-16 ENCOUNTER — Other Ambulatory Visit: Payer: No Typology Code available for payment source

## 2020-10-16 ENCOUNTER — Inpatient Hospital Stay (HOSPITAL_BASED_OUTPATIENT_CLINIC_OR_DEPARTMENT_OTHER): Payer: No Typology Code available for payment source | Admitting: Hematology and Oncology

## 2020-10-16 ENCOUNTER — Inpatient Hospital Stay: Payer: No Typology Code available for payment source | Attending: Hematology and Oncology

## 2020-10-16 ENCOUNTER — Other Ambulatory Visit: Payer: Self-pay | Admitting: Hematology and Oncology

## 2020-10-16 ENCOUNTER — Encounter: Payer: Self-pay | Admitting: Hematology and Oncology

## 2020-10-16 ENCOUNTER — Ambulatory Visit: Payer: No Typology Code available for payment source | Admitting: Hematology and Oncology

## 2020-10-16 ENCOUNTER — Other Ambulatory Visit: Payer: Self-pay

## 2020-10-16 VITALS — BP 117/76 | HR 58 | Temp 98.0°F | Resp 15 | Ht 72.0 in | Wt 174.2 lb

## 2020-10-16 DIAGNOSIS — I82411 Acute embolism and thrombosis of right femoral vein: Secondary | ICD-10-CM | POA: Diagnosis not present

## 2020-10-16 DIAGNOSIS — Z7901 Long term (current) use of anticoagulants: Secondary | ICD-10-CM | POA: Diagnosis not present

## 2020-10-16 LAB — CMP (CANCER CENTER ONLY)
ALT: 16 U/L (ref 0–44)
AST: 20 U/L (ref 15–41)
Albumin: 4 g/dL (ref 3.5–5.0)
Alkaline Phosphatase: 67 U/L (ref 38–126)
Anion gap: 8 (ref 5–15)
BUN: 12 mg/dL (ref 6–20)
CO2: 28 mmol/L (ref 22–32)
Calcium: 9.3 mg/dL (ref 8.9–10.3)
Chloride: 105 mmol/L (ref 98–111)
Creatinine: 1.1 mg/dL (ref 0.61–1.24)
GFR, Estimated: >60 mL/min (ref >60)
Glucose, Bld: 84 mg/dL (ref 70–99)
Potassium: 4.5 mmol/L (ref 3.5–5.1)
Sodium: 141 mmol/L (ref 135–145)
Total Bilirubin: 0.5 mg/dL (ref 0.3–1.2)
Total Protein: 7.3 g/dL (ref 6.5–8.1)

## 2020-10-16 LAB — CBC WITH DIFFERENTIAL (CANCER CENTER ONLY)
Abs Immature Granulocytes: 0.01 10*3/uL (ref 0.00–0.07)
Basophils Absolute: 0 10*3/uL (ref 0.0–0.1)
Basophils Relative: 0 %
Eosinophils Absolute: 0 10*3/uL (ref 0.0–0.5)
Eosinophils Relative: 1 %
HCT: 42.5 % (ref 39.0–52.0)
Hemoglobin: 13.7 g/dL (ref 13.0–17.0)
Immature Granulocytes: 0 %
Lymphocytes Relative: 51 %
Lymphs Abs: 1.8 10*3/uL (ref 0.7–4.0)
MCH: 29.9 pg (ref 26.0–34.0)
MCHC: 32.2 g/dL (ref 30.0–36.0)
MCV: 92.8 fL (ref 80.0–100.0)
Monocytes Absolute: 0.3 10*3/uL (ref 0.1–1.0)
Monocytes Relative: 10 %
Neutro Abs: 1.3 10*3/uL — ABNORMAL LOW (ref 1.7–7.7)
Neutrophils Relative %: 38 %
Platelet Count: 148 10*3/uL — ABNORMAL LOW (ref 150–400)
RBC: 4.58 MIL/uL (ref 4.22–5.81)
RDW: 13.8 % (ref 11.5–15.5)
WBC Count: 3.5 10*3/uL — ABNORMAL LOW (ref 4.0–10.5)
nRBC: 0 % (ref 0.0–0.2)

## 2020-10-16 NOTE — Progress Notes (Signed)
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 [place] LE showed occlusive thrombus is seen in the right common femoral and profundus femoral veins as well as at the saphenofemoral junction. Started on Eliquis therapy 5mg  BID.  08/04/2020: establish care with Dr. 10/02/2020   Interval History:  Bobby Herrera 55 y.o. male with medical history significant for an unprovoked RLE DVT who presents for a follow up visit. The patient's last visit was on 08/04/2020. In the interim since the last visit he is continued on his Eliquis therapy.  On exam today Bobby Herrera notes he has had no trouble with the blood thinners since he started taking it.  He has had maybe "4 missed doses" but he has not doubled up or taking any late doses.  He reports that he has had no issues bleeding, bruising, or dark stools.  He also notes that the medication has not been cost inhibitive.  He continues to have swelling in his right leg with a prominent mark left behind by his sock.  He denies any fevers, chills, sweats, nausea, vomiting or diarrhea.  A full 10 point ROS is listed below.  MEDICAL HISTORY:  History reviewed. No pertinent past medical history.  SURGICAL HISTORY: History reviewed. No pertinent surgical history.  SOCIAL HISTORY: Social History   Socioeconomic History  . Marital status: Single    Spouse name: Not on file  . Number of children: 0  . Years of education: Not on file  . Highest education level: Not on file  Occupational History  . Not on file  Tobacco Use  . Smoking status: Never Smoker  . Smokeless tobacco: Never Used  Substance and Sexual Activity  . Alcohol use: Never  . Drug use: Never  . Sexual activity: Not on file  Other Topics Concern  . Not  on file  Social History Narrative  . Not on file   Social Determinants of Health   Financial Resource Strain: Not on file  Food Insecurity: Not on file  Transportation Needs: Not on file  Physical Activity: Not on file  Stress: Not on file  Social Connections: Not on file  Intimate Partner Violence: Not on file    FAMILY HISTORY: Family History  Problem Relation Age of Onset  . Healthy Mother   . Pulmonary embolism Father     ALLERGIES:  has No Known Allergies.  MEDICATIONS:  Current Outpatient Medications  Medication Sig Dispense Refill  . apixaban (ELIQUIS) 5 MG TABS tablet Take 1 tablet (5 mg total) by mouth 2 (two) times daily. 60 tablet 3   No current facility-administered medications for this visit.    REVIEW OF SYSTEMS:   Constitutional: ( - ) fevers, ( - )  chills , ( - ) night sweats Eyes: ( - ) blurriness of vision, ( - ) double vision, ( - ) watery eyes Ears, nose, mouth, throat, and face: ( - ) mucositis, ( - ) sore throat Respiratory: ( - ) cough, ( - ) dyspnea, ( - ) wheezes Cardiovascular: ( - ) palpitation, ( - ) chest discomfort, ( - ) lower extremity swelling Gastrointestinal:  ( - ) nausea, ( - ) heartburn, ( - ) change in bowel habits Skin: ( - ) abnormal skin rashes Lymphatics: ( - )  new lymphadenopathy, ( - ) easy bruising Neurological: ( - ) numbness, ( - ) tingling, ( - ) new weaknesses Behavioral/Psych: ( - ) mood change, ( - ) new changes  All other systems were reviewed with the patient and are negative.  PHYSICAL EXAMINATION:  Vitals:   10/16/20 1449  BP: 117/76  Pulse: (!) 58  Resp: 15  Temp: 98 F (36.7 C)  SpO2: 99%   Filed Weights   10/16/20 1449  Weight: 174 lb 3.2 oz (79 kg)    GENERAL: well appearing middle aged [place] American male. alert, no distress and comfortable SKIN: skin color, texture, turgor are normal, no rashes or significant lesions EYES: conjunctiva are pink and non-injected, sclera clear LUNGS: clear to  auscultation and percussion with normal breathing effort HEART: regular rate & rhythm and no murmurs and no lower extremity edema Musculoskeletal: no cyanosis of digits and no clubbing  PSYCH: alert & oriented x 3, fluent speech NEURO: no focal motor/sensory deficits  LABORATORY DATA:  I have reviewed the data as listed CBC Latest Ref Rng & Units 10/16/2020 08/04/2020 07/13/2020  WBC 4.0 - 10.5 K/uL 3.5(L) 3.7(L) 4.9  Hemoglobin 13.0 - 17.0 g/dL 69.6 78.9 38.1  Hematocrit 39.0 - 52.0 % 42.5 42.2 42.7  Platelets 150 - 400 K/uL 148(L) 154 122(L)    CMP Latest Ref Rng & Units 10/16/2020 08/04/2020 07/13/2020  Glucose 70 - 99 mg/dL 84 97 96  BUN 6 - 20 mg/dL 12 12 15   Creatinine 0.61 - 1.24 mg/dL 0.17 5.10  Sodium 135 - 145 mmol/L 141 138 135  Potassium 3.5 - 5.1 mmol/L 4.5 4.5 3.7  Chloride 98 - 111 mmol/L 105 104 99  CO2 22 - 32 mmol/L 28 27 25   Calcium 8.9 - 10.3 mg/dL 9.3 9.3 9.1  Total Protein 6.5 - 8.1 g/dL 7.3 7.5 -  Total Bilirubin 0.3 - 1.2 mg/dL 0.5 0.4 -  Alkaline Phos 38 - 126 U/L 67 84 -  AST 15 - 41 U/L 20 21 -  ALT 0 - 44 U/L 16 20 -    No results found for: MPROTEIN No results found for: KPAFRELGTCHN, LAMBDASER, KAPLAMBRATIO   RADIOGRAPHIC STUDIES: No results found.  ASSESSMENT & PLAN Bobby Herrera 55 y.o. male with no remarkable medical history who presents for evaluation of an unprovoked right lower extremity DVT.  I reviewed labs, reviewed records, schedule the patient the findings most consistent with an unprovoked right lower extremity DVT.  Given these findings I would recommend that we proceed with indefinite anticoagulation.  He is tolerating Eliquis therapy well and I think this is a reasonable option for him.  Today we will order labs in order to assure that he is appropriate kidney function and liver function for this therapy.  Additionally we ordered antiphospholipid antibodies (which were negative) in order to assure that he is on the appropriate  anticoagulation therapy.  # Unprovoked DVT of Right Leg --continue Eliquis 5 mg twice daily.  The duration of therapy is indefinite given the unprovoked nature of his DVT. --We will order CBC and CMP at each visit --antiphospholipid antibodies were negative --After 6 months of therapy (July 2022) can consider de-escalation down to maintenance dose Eliquis at 2.5 mg twice daily.  If there are financial issues or if the patient does not wish to continue anticoagulation therapy we could consider aspirin 81 mg daily instead. --Return to clinic in 3 months time in order to reevaluate  No orders of the  defined types were placed in this encounter.   All questions were answered. The patient knows to call the clinic with any problems, questions or concerns.  A total of more than 30 minutes were spent on this encounter and over half of that time was spent on counseling and coordination of care as outlined above.   Ulysees Barns, MD Department of Hematology/Oncology [facility] Phone: [phone] Pager: [phone] Email: [email or web address]  10/16/2020 3:34 PM

## 2020-12-10 ENCOUNTER — Other Ambulatory Visit: Payer: Self-pay | Admitting: Hematology and Oncology

## 2020-12-11 ENCOUNTER — Other Ambulatory Visit: Payer: Self-pay | Admitting: *Deleted

## 2020-12-11 MED ORDER — ELIQUIS 5 MG PO TABS: 1.0000 | ORAL_TABLET | Freq: Two times a day (BID) | ORAL | 3 refills | Status: DC

## 2021-01-09 ENCOUNTER — Telehealth: Payer: Self-pay | Admitting: Hematology and Oncology

## 2021-01-09 NOTE — Telephone Encounter (Signed)
R/s 7/18 appt due to provider on call scheduled. Called and left msg. 

## 2021-01-15 ENCOUNTER — Other Ambulatory Visit: Payer: No Typology Code available for payment source

## 2021-01-15 ENCOUNTER — Ambulatory Visit: Payer: No Typology Code available for payment source | Admitting: Hematology and Oncology

## 2021-01-17 ENCOUNTER — Other Ambulatory Visit: Payer: Self-pay | Admitting: Hematology and Oncology

## 2021-01-17 ENCOUNTER — Other Ambulatory Visit: Payer: Self-pay

## 2021-01-17 ENCOUNTER — Inpatient Hospital Stay: Payer: No Typology Code available for payment source | Attending: Hematology and Oncology

## 2021-01-17 ENCOUNTER — Inpatient Hospital Stay (HOSPITAL_BASED_OUTPATIENT_CLINIC_OR_DEPARTMENT_OTHER): Payer: No Typology Code available for payment source | Admitting: Hematology and Oncology

## 2021-01-17 VITALS — BP 115/79 | HR 58 | Temp 97.4°F | Resp 18 | Ht 72.0 in | Wt 165.2 lb

## 2021-01-17 DIAGNOSIS — Z86718 Personal history of other venous thrombosis and embolism: Secondary | ICD-10-CM | POA: Diagnosis not present

## 2021-01-17 DIAGNOSIS — I82411 Acute embolism and thrombosis of right femoral vein: Secondary | ICD-10-CM

## 2021-01-17 DIAGNOSIS — Z7901 Long term (current) use of anticoagulants: Secondary | ICD-10-CM | POA: Diagnosis not present

## 2021-01-17 LAB — CMP (CANCER CENTER ONLY)
ALT: 22 U/L (ref 0–44)
AST: 22 U/L (ref 15–41)
Albumin: 4.1 g/dL (ref 3.5–5.0)
Alkaline Phosphatase: 114 U/L (ref 38–126)
Anion gap: 7 (ref 5–15)
BUN: 11 mg/dL (ref 6–20)
CO2: 26 mmol/L (ref 22–32)
Calcium: 9.4 mg/dL (ref 8.9–10.3)
Chloride: 107 mmol/L (ref 98–111)
Creatinine: 0.97 mg/dL (ref 0.61–1.24)
GFR, Estimated: >60 mL/min (ref >60)
Glucose, Bld: 92 mg/dL (ref 70–99)
Potassium: 4.2 mmol/L (ref 3.5–5.1)
Sodium: 140 mmol/L (ref 135–145)
Total Bilirubin: 0.8 mg/dL (ref 0.3–1.2)
Total Protein: 6.7 g/dL (ref 6.5–8.1)

## 2021-01-17 LAB — CBC WITH DIFFERENTIAL (CANCER CENTER ONLY)
Abs Immature Granulocytes: 0 10*3/uL (ref 0.00–0.07)
Basophils Absolute: 0 10*3/uL (ref 0.0–0.1)
Basophils Relative: 0 %
Eosinophils Absolute: 0.1 10*3/uL (ref 0.0–0.5)
Eosinophils Relative: 1 %
HCT: 38.9 % — ABNORMAL LOW (ref 39.0–52.0)
Hemoglobin: 13.2 g/dL (ref 13.0–17.0)
Immature Granulocytes: 0 %
Lymphocytes Relative: 57 %
Lymphs Abs: 2 10*3/uL (ref 0.7–4.0)
MCH: 30.3 pg (ref 26.0–34.0)
MCHC: 33.9 g/dL (ref 30.0–36.0)
MCV: 89.2 fL (ref 80.0–100.0)
Monocytes Absolute: 0.4 10*3/uL (ref 0.1–1.0)
Monocytes Relative: 12 %
Neutro Abs: 1 10*3/uL — ABNORMAL LOW (ref 1.7–7.7)
Neutrophils Relative %: 30 %
Platelet Count: 138 10*3/uL — ABNORMAL LOW (ref 150–400)
RBC: 4.36 MIL/uL (ref 4.22–5.81)
RDW: 13.9 % (ref 11.5–15.5)
WBC Count: 3.5 10*3/uL — ABNORMAL LOW (ref 4.0–10.5)
nRBC: 0 % (ref 0.0–0.2)

## 2021-01-17 MED ORDER — APIXABAN 2.5 MG PO TABS: 2.5000 mg | ORAL_TABLET | Freq: Two times a day (BID) | ORAL | 1 refills | Status: DC

## 2021-01-17 NOTE — Progress Notes (Signed)
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 [place] LE showed occlusive thrombus is seen in the right common femoral and profundus femoral veins as well as at the saphenofemoral junction. Started on Eliquis therapy 5mg  BID.  08/04/2020: establish care with Dr. 10/02/2020   Interval History:  Bobby Herrera 55 y.o. male with medical history significant for an unprovoked RLE DVT who presents for a follow up visit. The patient's last visit was on 10/16/2020. In the interim since the last visit he is continued on his Eliquis therapy.  On exam today Bobby Herrera notes he has been well in the interim since our last visit.  He notes he is not having any bleeding, bruising, or dark stools.  He notes that he has about 28 days left of the full dose Eliquis and is willing able to transition to the 2.5 mg twice daily.  He notes he not having any trouble with his right leg.  He denies any pain or cramping or swelling.  He does note some rare abnormal sensations in his leg which are transient.  He denies any fevers, chills, sweats, nausea, vomiting or diarrhea.  A full 10 point ROS is listed below.  MEDICAL HISTORY:  No past medical history on file.  SURGICAL HISTORY: No past surgical history on file.  SOCIAL HISTORY: Social History   Socioeconomic History   Marital status: Single    Spouse name: Not on file   Number of children: 0   Years of education: Not on file   Highest education level: Not on file  Occupational History   Not on file  Tobacco Use   Smoking status: Never   Smokeless tobacco: Never  Substance and Sexual Activity   Alcohol use: Never   Drug use: Never   Sexual activity: Not on file  Other Topics Concern   Not on file  Social History  Narrative   Not on file   Social Determinants of Health   Financial Resource Strain: Not on file  Food Insecurity: Not on file  Transportation Needs: Not on file  Physical Activity: Not on file  Stress: Not on file  Social Connections: Not on file  Intimate Partner Violence: Not on file    FAMILY HISTORY: Family History  Problem Relation Age of Onset   Healthy Mother    Pulmonary embolism Father     ALLERGIES:  has No Known Allergies.  MEDICATIONS:  Current Outpatient Medications  Medication Sig Dispense Refill   apixaban (ELIQUIS) 2.5 MG TABS tablet Take 1 tablet (2.5 mg total) by mouth 2 (two) times daily. 180 tablet 1   cyanocobalamin 1000 MCG tablet Take 1,000 mcg by mouth daily.     No current facility-administered medications for this visit.    REVIEW OF SYSTEMS:   Constitutional: ( - ) fevers, ( - )  chills , ( - ) night sweats Eyes: ( - ) blurriness of vision, ( - ) double vision, ( - ) watery eyes Ears, nose, mouth, throat, and face: ( - ) mucositis, ( - ) sore throat Respiratory: ( - ) cough, ( - ) dyspnea, ( - ) wheezes Cardiovascular: ( - ) palpitation, ( - ) chest discomfort, ( - ) lower extremity swelling Gastrointestinal:  ( - ) nausea, ( - )  heartburn, ( - ) change in bowel habits Skin: ( - ) abnormal skin rashes Lymphatics: ( - ) new lymphadenopathy, ( - ) easy bruising Neurological: ( - ) numbness, ( - ) tingling, ( - ) new weaknesses Behavioral/Psych: ( - ) mood change, ( - ) new changes  All other systems were reviewed with the patient and are negative.  PHYSICAL EXAMINATION:  Vitals:   01/17/21 1526  BP: 115/79  Pulse: (!) 58  Resp: 18  Temp: (!) 97.4 F (36.3 C)  SpO2: 100%    Filed Weights   01/17/21 1526  Weight: 165 lb 3.2 oz (74.9 kg)     GENERAL: well appearing middle aged [place] American male. alert, no distress and comfortable SKIN: skin color, texture, turgor are normal, no rashes or significant lesions EYES: conjunctiva  are pink and non-injected, sclera clear LUNGS: clear to auscultation and percussion with normal breathing effort HEART: regular rate & rhythm and no murmurs and no lower extremity edema Musculoskeletal: no cyanosis of digits and no clubbing  PSYCH: alert & oriented x 3, fluent speech NEURO: no focal motor/sensory deficits  LABORATORY DATA:  I have reviewed the data as listed CBC Latest Ref Rng & Units 01/17/2021 10/16/2020 08/04/2020  WBC 4.0 - 10.5 K/uL 3.5(L) 3.5(L) 3.7(L)  Hemoglobin 13.0 - 17.0 g/dL 65.7 84.6 96.2  Hematocrit 39.0 - 52.0 % 38.9(L) 42.5 42.2  Platelets 150 - 400 K/uL 138(L) 148(L) 154    CMP Latest Ref Rng & Units 01/17/2021 10/16/2020 08/04/2020  Glucose 70 - 99 mg/dL 92 84 97  BUN 6 - 20 mg/dL 11 12 12   Creatinine 0.61 - 1.24 mg/dL 9.52 8.41  Sodium 135 - 145 mmol/L 140 141 138  Potassium 3.5 - 5.1 mmol/L 4.2 4.5 4.5  Chloride 98 - 111 mmol/L 107 105 104  CO2 22 - 32 mmol/L 26 28 27   Calcium 8.9 - 10.3 mg/dL 9.4 9.3 9.3  Total Protein 6.5 - 8.1 g/dL 6.7 7.3 7.5  Total Bilirubin 0.3 - 1.2 mg/dL 0.8 0.5 0.4  Alkaline Phos 38 - 126 U/L 114 67 84  AST 15 - 41 U/L 22 20 21   ALT 0 - 44 U/L 22 16 20     No results found for: MPROTEIN No results found for: KPAFRELGTCHN, LAMBDASER, KAPLAMBRATIO   RADIOGRAPHIC STUDIES: No results found.  ASSESSMENT & PLAN Bobby Herrera 55 y.o. male with no remarkable medical history who presents for evaluation of an unprovoked right lower extremity DVT.   I reviewed labs, reviewed records, schedule the patient the findings most consistent with an unprovoked right lower extremity DVT.  Given these findings I would recommend that we proceed with indefinite anticoagulation.  He is tolerating Eliquis therapy well and I think this is a reasonable option for him.  Today we will order labs in order to assure that he is appropriate kidney function and liver function for this therapy.  Additionally we ordered antiphospholipid antibodies  (which were negative) in order to assure that he is on the appropriate anticoagulation therapy.  Today we discussed transitioning to maintenance dose Eliquis.  Maintenance dose consists of Eliquis 2.5 mg twice daily instead of the full strength 5 mg twice daily.  Studies have shown that this is equally efficacious in preventing VTE while decreasing the bleeding risk.  The patient was agreeable to dropping down to this lower dose of maintenance Eliquis after he completes his current bottle of full-strength Eliquis.  We will plan to see him back in  6 months time in order to continue monitoring.  # Unprovoked DVT of Right Leg --continue Eliquis 5 mg twice daily until he runs out of his current supply.  The duration of therapy is indefinite given the unprovoked nature of his DVT. --The patient is completed 6 months of full-strength Eliquis therapy.  Today we discussed maintenance dosing with 2.5 mg twice daily.  He was agreeable to transitioning to this lower dose. --We will order CBC and CMP at each visit --antiphospholipid antibodies were negative -- If there are financial issues or if the patient does not wish to continue anticoagulation therapy we could consider aspirin 81 mg daily instead. --Return to clinic in 6 months time in order to reevaluate  No orders of the defined types were placed in this encounter.   All questions were answered. The patient knows to call the clinic with any problems, questions or concerns.  A total of more than 30 minutes were spent on this encounter and over half of that time was spent on counseling and coordination of care as outlined above.   Ulysees Barns, MD Department of Hematology/Oncology [facility] Phone: [phone] Pager: [phone] Email: [email or web address]  01/29/2021 2:37 PM

## 2021-01-30 ENCOUNTER — Telehealth: Payer: Self-pay | Admitting: Hematology and Oncology

## 2021-01-30 NOTE — Telephone Encounter (Signed)
Scheduled appts per 8/1 sch msg. Called pt, no answer. Left msg with appts date and times.  

## 2021-07-27 ENCOUNTER — Telehealth: Payer: Self-pay | Admitting: Hematology and Oncology

## 2021-07-27 NOTE — Telephone Encounter (Signed)
R/s per 1/27 sch msg, message has been left with pt

## 2021-07-31 ENCOUNTER — Inpatient Hospital Stay: Payer: No Typology Code available for payment source

## 2021-07-31 ENCOUNTER — Inpatient Hospital Stay: Payer: No Typology Code available for payment source | Admitting: Hematology and Oncology

## 2021-07-31 ENCOUNTER — Other Ambulatory Visit: Payer: Self-pay | Admitting: Hematology and Oncology

## 2021-07-31 ENCOUNTER — Telehealth: Payer: Self-pay | Admitting: Hematology and Oncology

## 2021-07-31 DIAGNOSIS — I82411 Acute embolism and thrombosis of right femoral vein: Secondary | ICD-10-CM

## 2021-07-31 NOTE — Progress Notes (Signed)
Rescheduled

## 2021-07-31 NOTE — Telephone Encounter (Signed)
Sch per 1/31 inbasket,[place]aware °

## 2021-08-01 ENCOUNTER — Ambulatory Visit: Payer: No Typology Code available for payment source | Admitting: Hematology and Oncology

## 2021-08-01 ENCOUNTER — Other Ambulatory Visit: Payer: No Typology Code available for payment source

## 2021-08-10 ENCOUNTER — Inpatient Hospital Stay (HOSPITAL_BASED_OUTPATIENT_CLINIC_OR_DEPARTMENT_OTHER): Payer: No Typology Code available for payment source | Admitting: Hematology and Oncology

## 2021-08-10 ENCOUNTER — Inpatient Hospital Stay: Payer: No Typology Code available for payment source | Attending: Hematology and Oncology

## 2021-08-10 ENCOUNTER — Other Ambulatory Visit: Payer: Self-pay

## 2021-08-10 VITALS — BP 131/82 | HR 58 | Temp 97.8°F | Resp 15 | Ht 72.0 in | Wt 167.3 lb

## 2021-08-10 DIAGNOSIS — Z7901 Long term (current) use of anticoagulants: Secondary | ICD-10-CM | POA: Diagnosis present

## 2021-08-10 DIAGNOSIS — I82411 Acute embolism and thrombosis of right femoral vein: Secondary | ICD-10-CM

## 2021-08-10 DIAGNOSIS — Z86718 Personal history of other venous thrombosis and embolism: Secondary | ICD-10-CM | POA: Insufficient documentation

## 2021-08-10 LAB — CBC WITH DIFFERENTIAL (CANCER CENTER ONLY)
Abs Immature Granulocytes: 0.01 10*3/uL (ref 0.00–0.07)
Basophils Absolute: 0 10*3/uL (ref 0.0–0.1)
Basophils Relative: 0 %
Eosinophils Absolute: 0.1 10*3/uL (ref 0.0–0.5)
Eosinophils Relative: 2 %
HCT: 42 % (ref 39.0–52.0)
Hemoglobin: 13.8 g/dL (ref 13.0–17.0)
Immature Granulocytes: 0 %
Lymphocytes Relative: 45 %
Lymphs Abs: 1.5 10*3/uL (ref 0.7–4.0)
MCH: 29.9 pg (ref 26.0–34.0)
MCHC: 32.9 g/dL (ref 30.0–36.0)
MCV: 91.1 fL (ref 80.0–100.0)
Monocytes Absolute: 0.4 10*3/uL (ref 0.1–1.0)
Monocytes Relative: 11 %
Neutro Abs: 1.5 10*3/uL — ABNORMAL LOW (ref 1.7–7.7)
Neutrophils Relative %: 42 %
Platelet Count: 118 10*3/uL — ABNORMAL LOW (ref 150–400)
RBC: 4.61 MIL/uL (ref 4.22–5.81)
RDW: 14.1 % (ref 11.5–15.5)
WBC Count: 3.5 10*3/uL — ABNORMAL LOW (ref 4.0–10.5)
nRBC: 0 % (ref 0.0–0.2)

## 2021-08-10 LAB — CMP (CANCER CENTER ONLY)
ALT: 22 U/L (ref 0–44)
AST: 19 U/L (ref 15–41)
Albumin: 4 g/dL (ref 3.5–5.0)
Alkaline Phosphatase: 72 U/L (ref 38–126)
Anion gap: 5 (ref 5–15)
BUN: 12 mg/dL (ref 6–20)
CO2: 27 mmol/L (ref 22–32)
Calcium: 8.9 mg/dL (ref 8.9–10.3)
Chloride: 107 mmol/L (ref 98–111)
Creatinine: 1.02 mg/dL (ref 0.61–1.24)
GFR, Estimated: >60 mL/min (ref >60)
Glucose, Bld: 96 mg/dL (ref 70–99)
Potassium: 4.3 mmol/L (ref 3.5–5.1)
Sodium: 139 mmol/L (ref 135–145)
Total Bilirubin: 0.5 mg/dL (ref 0.3–1.2)
Total Protein: 6.8 g/dL (ref 6.5–8.1)

## 2021-08-10 MED ORDER — APIXABAN 2.5 MG PO TABS: 2.5000 mg | ORAL_TABLET | Freq: Two times a day (BID) | ORAL | 1 refills | Status: DC

## 2021-08-11 NOTE — Progress Notes (Signed)
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 [place] LE showed occlusive thrombus is seen in the right common femoral and profundus femoral veins as well as at the saphenofemoral junction. Started on Eliquis therapy 5mg  BID.  08/04/2020: establish care with Dr. Lorenso Courier  01/17/2021: Transition to maintenance Eliquis therapy 2.5 mg twice daily  Interval History:  Bobby Herrera 56 y.o. male with medical history significant for an unprovoked RLE DVT who presents for a follow up visit. The patient's last visit was on 01/17/2021. In the interim since the last visit he is continued on his Eliquis therapy.  On exam today Mr. Squillante notes he has been well in the interim since her last visit.  He is had no issues with bleeding, bruising, or dark stools.  He notes that he is able to for the medication without difficulty as it is well covered by his health insurance.  He is not having any signs or symptoms concerning for recurrent VTE.  Overall he is at his baseline level of health and is willing and able to continue on Eliquis therapy at this time.  He denies any fevers, chills, sweats, nausea, vomiting or diarrhea.  A full 10 point ROS is listed below.  MEDICAL HISTORY:  No past medical history on file.  SURGICAL HISTORY: No past surgical history on file.  SOCIAL HISTORY: Social History   Socioeconomic History   Marital status: Single    Spouse name: Not on file   Number of children: 0   Years of education: Not on file   Highest education level: Not on file  Occupational History   Not on file  Tobacco Use   Smoking status: Never   Smokeless tobacco: Never  Substance and Sexual Activity   Alcohol use: Never   Drug use: Never   Sexual activity: Not on  file  Other Topics Concern   Not on file  Social History Narrative   Not on file   Social Determinants of Health   Financial Resource Strain: Not on file  Food Insecurity: Not on file  Transportation Needs: Not on file  Physical Activity: Not on file  Stress: Not on file  Social Connections: Not on file  Intimate Partner Violence: Not on file    FAMILY HISTORY: Family History  Problem Relation Age of Onset   Healthy Mother    Pulmonary embolism Father     ALLERGIES:  has No Known Allergies.  MEDICATIONS:  Current Outpatient Medications  Medication Sig Dispense Refill   apixaban (ELIQUIS) 2.5 MG TABS tablet Take 1 tablet (2.5 mg total) by mouth 2 (two) times daily. 180 tablet 1   cyanocobalamin 1000 MCG tablet Take 1,000 mcg by mouth daily.     No current facility-administered medications for this visit.    REVIEW OF SYSTEMS:   Constitutional: ( - ) fevers, ( - )  chills , ( - ) night sweats Eyes: ( - ) blurriness of vision, ( - ) double vision, ( - ) watery eyes Ears, nose, mouth, throat, and face: ( - ) mucositis, ( - ) sore throat Respiratory: ( - ) cough, ( - ) dyspnea, ( - ) wheezes Cardiovascular: ( - ) palpitation, ( - ) chest discomfort, ( - ) lower extremity swelling Gastrointestinal:  ( - )  nausea, ( - ) heartburn, ( - ) change in bowel habits Skin: ( - ) abnormal skin rashes Lymphatics: ( - ) new lymphadenopathy, ( - ) easy bruising Neurological: ( - ) numbness, ( - ) tingling, ( - ) new weaknesses Behavioral/Psych: ( - ) mood change, ( - ) new changes  All other systems were reviewed with the patient and are negative.  PHYSICAL EXAMINATION:  Vitals:   08/10/21 1451  BP: 131/82  Pulse: (!) 58  Resp: 15  Temp: 97.8 F (36.6 C)  SpO2: 100%    Filed Weights   08/10/21 1451  Weight: 167 lb 4.8 oz (75.9 kg)     GENERAL: well appearing middle aged [place] American male. alert, no distress and comfortable SKIN: skin color, texture, turgor are  normal, no rashes or significant lesions EYES: conjunctiva are pink and non-injected, sclera clear LUNGS: clear to auscultation and percussion with normal breathing effort HEART: regular rate & rhythm and no murmurs and no lower extremity edema Musculoskeletal: no cyanosis of digits and no clubbing  PSYCH: alert & oriented x 3, fluent speech NEURO: no focal motor/sensory deficits  LABORATORY DATA:  I have reviewed the data as listed CBC Latest Ref Rng & Units 08/10/2021 01/17/2021 10/16/2020  WBC 4.0 - 10.5 K/uL 3.5(L) 3.5(L) 3.5(L)  Hemoglobin 13.0 - 17.0 g/dL 13.8 13.2 13.7  Hematocrit 39.0 - 52.0 % 42.0 38.9(L) 42.5  Platelets 150 - 400 K/uL 118(L) 138(L) 148(L)    CMP Latest Ref Rng & Units 08/10/2021 01/17/2021 10/16/2020  Glucose 70 - 99 mg/dL 96 92 84  BUN 6 - 20 mg/dL 12 11 12   Creatinine 0.61 - 1.24 mg/dL 1.02 0.97 1.10  Sodium 135 - 145 mmol/L 139 140 141  Potassium 3.5 - 5.1 mmol/L 4.3 4.2 4.5  Chloride 98 - 111 mmol/L 107 107 105  CO2 22 - 32 mmol/L 27 26 28   Calcium 8.9 - 10.3 mg/dL 8.9 9.4 9.3  Total Protein 6.5 - 8.1 g/dL 6.8 6.7 7.3  Total Bilirubin 0.3 - 1.2 mg/dL 0.5 0.8 0.5  Alkaline Phos 38 - 126 U/L 72 114 67  AST 15 - 41 U/L 19 22 20   ALT 0 - 44 U/L 22 22 16     No results found for: MPROTEIN No results found for: KPAFRELGTCHN, LAMBDASER, KAPLAMBRATIO   RADIOGRAPHIC STUDIES: No results found.  ASSESSMENT & PLAN Bobby Herrera 56 y.o. male with no remarkable medical history who presents for evaluation of an unprovoked right lower extremity DVT.   I reviewed labs, reviewed records, schedule the patient the findings most consistent with an unprovoked right lower extremity DVT.  Given these findings I would recommend that we proceed with indefinite anticoagulation.  He is tolerating Eliquis therapy well and I think this is a reasonable option for him.  Today we will order labs in order to assure that he is appropriate kidney function and liver function for this  therapy.  Additionally we ordered antiphospholipid antibodies (which were negative) in order to assure that he is on the appropriate anticoagulation therapy.  Previously we discussed transitioning to maintenance dose Eliquis.  Maintenance dose consists of Eliquis 2.5 mg twice daily instead of the full strength 5 mg twice daily.  Studies have shown that this is equally efficacious in preventing VTE while decreasing the bleeding risk.  The patient was agreeable to dropping down to this lower dose of maintenance Eliquis after he completes his current bottle of full-strength Eliquis.  We will plan to see  him back in 6 months time in order to continue monitoring.  # Unprovoked DVT of Right Leg --continue Eliquis 2.5 mg twice daily until he runs out of his current supply.  The duration of therapy is indefinite given the unprovoked nature of his DVT. --The patient is completed 6 months of full-strength Eliquis therapy.  Previously we discussed maintenance dosing with 2.5 mg twice daily.  He was agreeable to transitioning to this lower dose and is currently on 2.5 mg twice daily --We will order CBC and CMP at each visit --antiphospholipid antibodies were negative -- If there are financial issues or if the patient does not wish to continue anticoagulation therapy we could consider aspirin 81 mg daily instead. --Return to clinic in 6 months time in order to reevaluate  No orders of the defined types were placed in this encounter.   All questions were answered. The patient knows to call the clinic with any problems, questions or concerns.  A total of more than 30 minutes were spent on this encounter and over half of that time was spent on counseling and coordination of care as outlined above.   Ledell Peoples, MD Department of Hematology/Oncology [place] at [facility] Phone: [phone] Pager: [phone] Email: [email or web address]  08/11/2021 5:13 PM

## 2021-08-13 ENCOUNTER — Telehealth: Payer: Self-pay | Admitting: Hematology and Oncology

## 2021-08-13 NOTE — Telephone Encounter (Signed)
Scheduled per 2/10 los, pt has been called and confirmed appt

## 2021-09-28 IMAGING — US US EXTREM LOW VENOUS*R*
1 series · 14 of 24 positions shown · non-contrast
Comparison: None.

CLINICAL DATA: Right lower extremity pain and swelling for 3 days.

EXAM:
RIGHT LOWER EXTREMITY VENOUS DOPPLER ULTRASOUND
TECHNIQUE: Gray-scale sonography with compression, as well as color and duplex
ultrasound, were performed to evaluate the deep venous system(s)
from the level of the common femoral vein through the popliteal and
proximal calf veins.

[Series 1: us extrem low venous*right* · 14 of 42 slices shown]
[im 1/42]
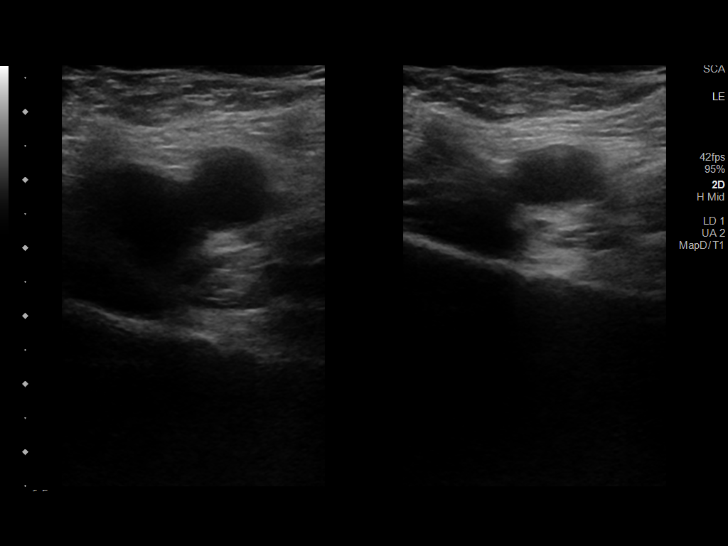
[im 4/42]
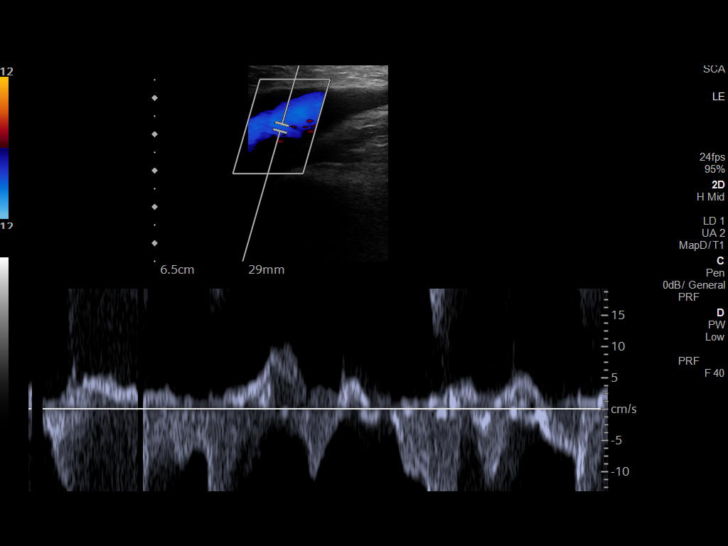
[im 8/42]
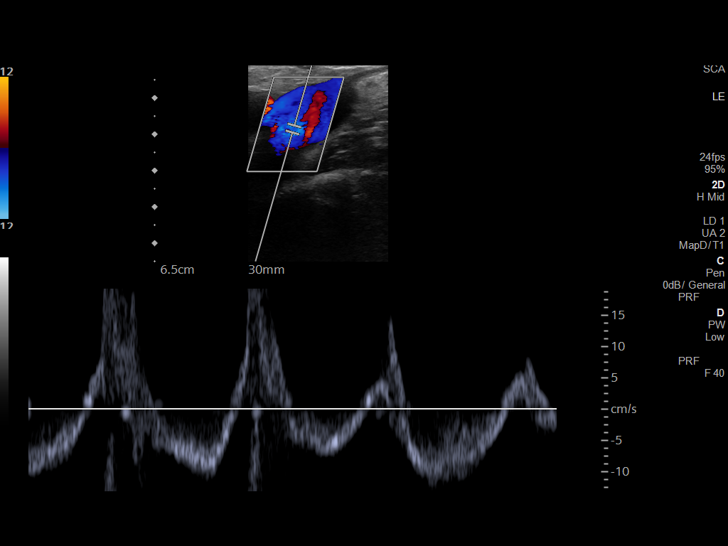
[im 11/42]
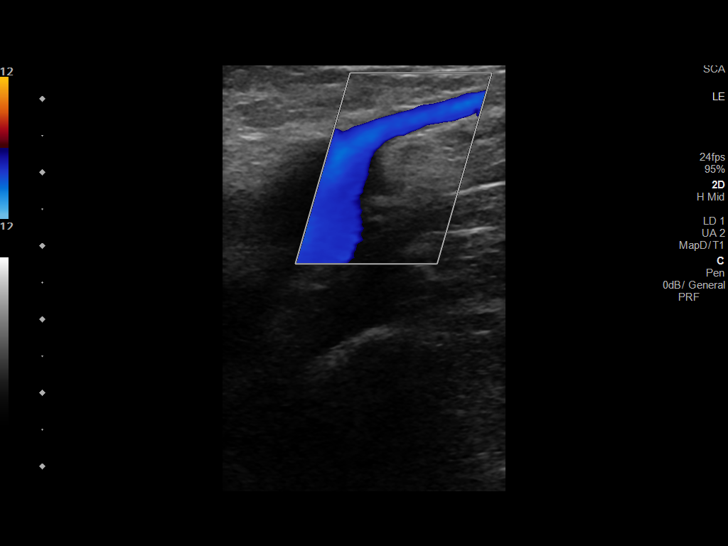
[im 13/42]
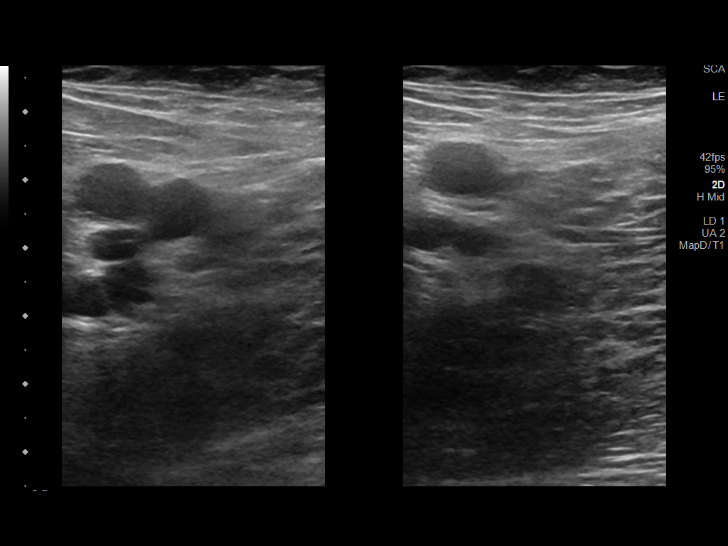
[im 17/42]
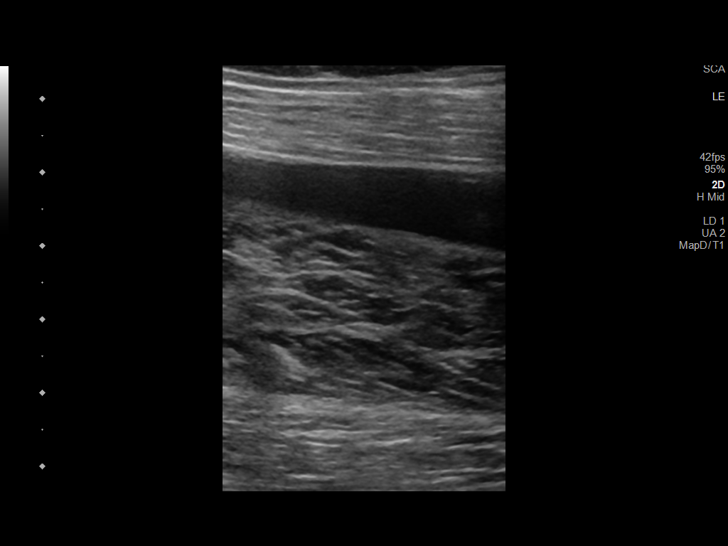
[im 20/42]
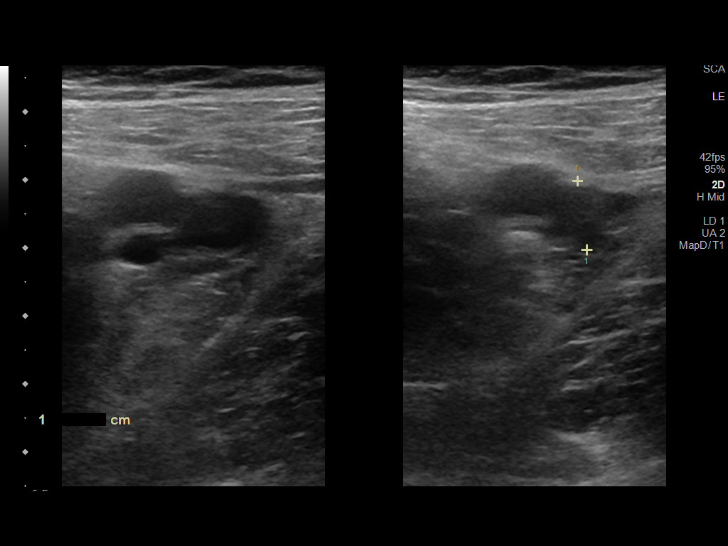
[im 22/42]
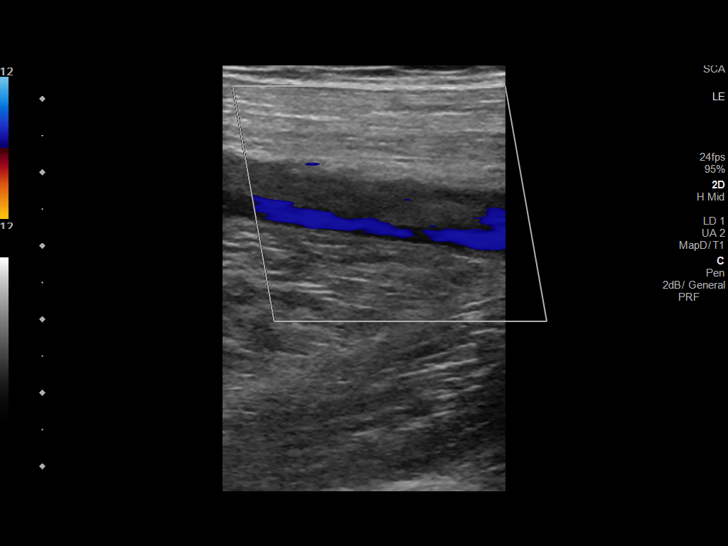
[im 25/42]
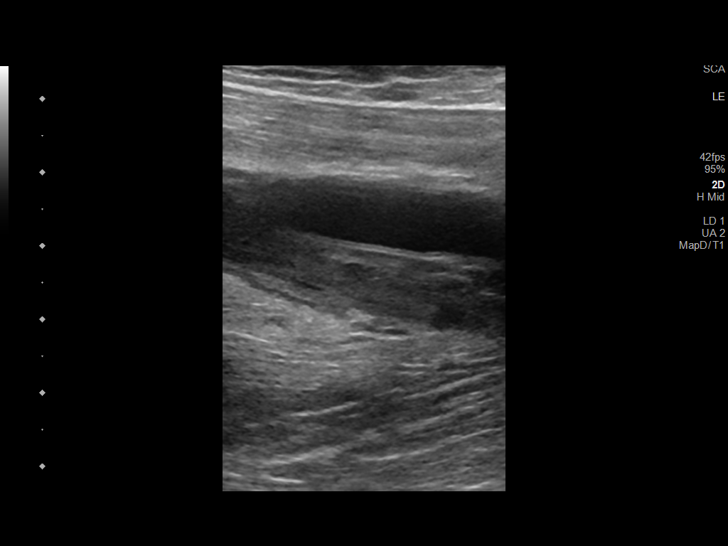
[im 29/42]
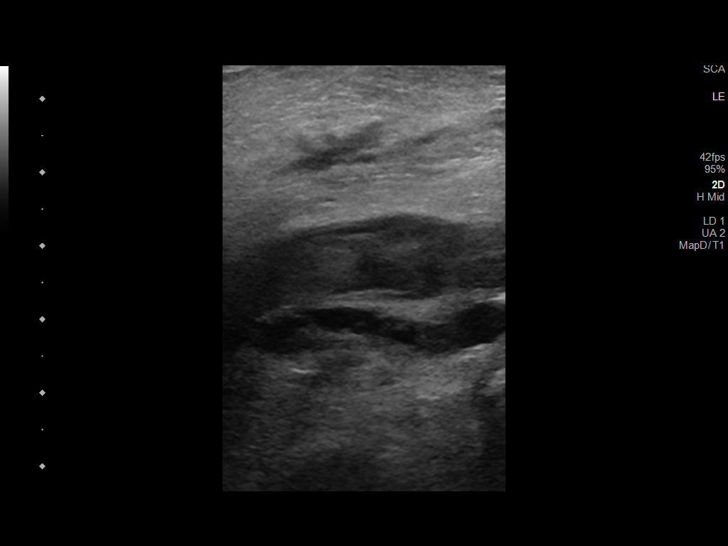
[im 33/42]
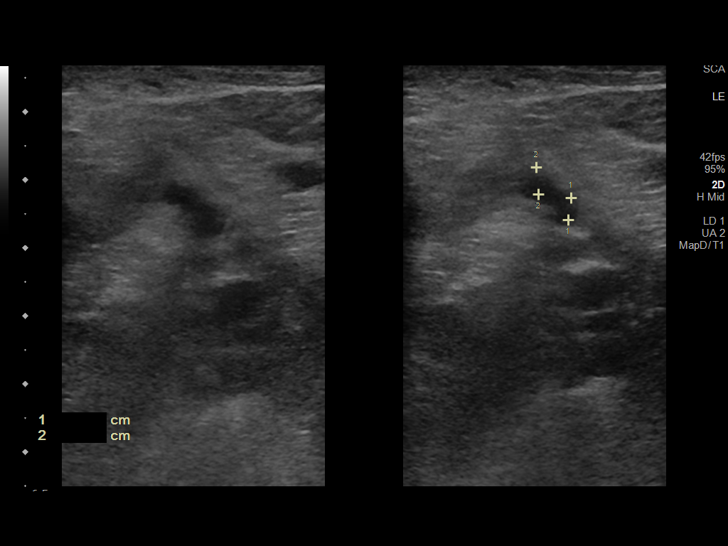
[im 34/42]
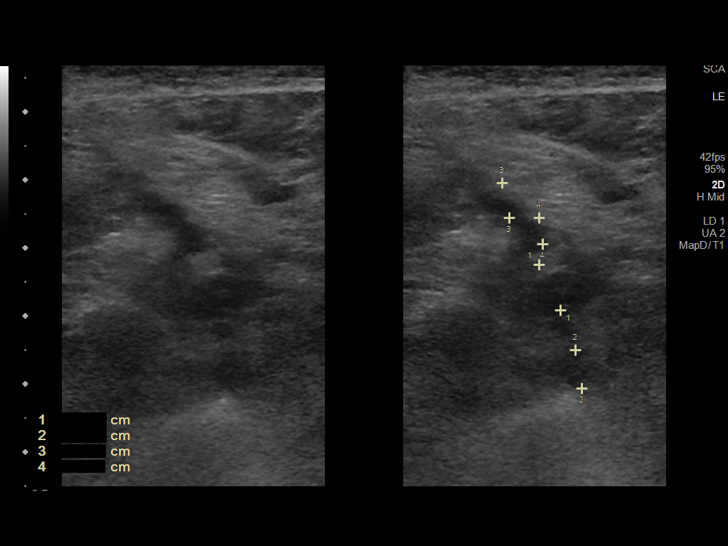
[im 38/42]
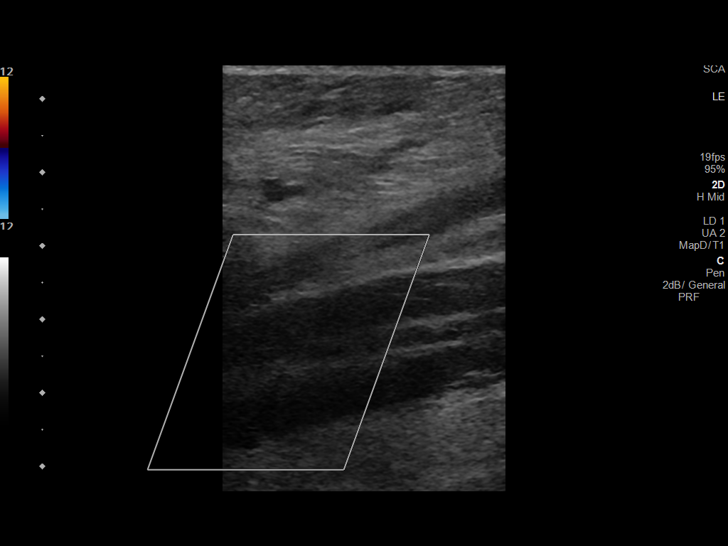
[im 42/42]
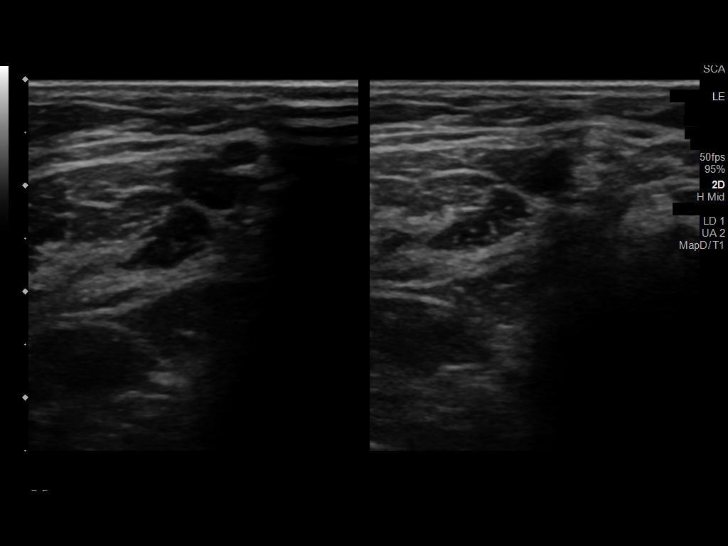

[14 of 24 positions shown; findings below may reference images not displayed]

FINDINGS: VENOUS

Occlusive thrombus is seen in the right common femoral and profundus
femoral veins as well as at the saphenofemoral junction.

Limited views of the contralateral common femoral vein are
unremarkable.

OTHER

None.

Limitations: None.
IMPRESSION: Positive for acute DVT as described above.

Critical Value/emergent results were called by telephone at the time
of interpretation on 07/13/2020 at [DATE] to provider BUGATTI
TASSU , who verbally acknowledged these results.

## 2022-01-16 ENCOUNTER — Other Ambulatory Visit: Payer: Self-pay | Admitting: Hematology and Oncology

## 2022-02-08 ENCOUNTER — Other Ambulatory Visit: Payer: Self-pay

## 2022-02-08 ENCOUNTER — Inpatient Hospital Stay: Payer: No Typology Code available for payment source | Attending: Hematology and Oncology

## 2022-02-08 ENCOUNTER — Inpatient Hospital Stay (HOSPITAL_BASED_OUTPATIENT_CLINIC_OR_DEPARTMENT_OTHER): Payer: No Typology Code available for payment source | Admitting: Hematology and Oncology

## 2022-02-08 ENCOUNTER — Other Ambulatory Visit: Payer: Self-pay | Admitting: Hematology and Oncology

## 2022-02-08 VITALS — BP 130/84 | HR 52 | Temp 98.3°F | Resp 16 | Wt 170.1 lb

## 2022-02-08 DIAGNOSIS — I82411 Acute embolism and thrombosis of right femoral vein: Secondary | ICD-10-CM

## 2022-02-08 DIAGNOSIS — Z86718 Personal history of other venous thrombosis and embolism: Secondary | ICD-10-CM | POA: Diagnosis present

## 2022-02-08 DIAGNOSIS — Z7901 Long term (current) use of anticoagulants: Secondary | ICD-10-CM | POA: Diagnosis not present

## 2022-02-08 LAB — CBC WITH DIFFERENTIAL (CANCER CENTER ONLY)
Abs Immature Granulocytes: 0 10*3/uL (ref 0.00–0.07)
Basophils Absolute: 0 10*3/uL (ref 0.0–0.1)
Basophils Relative: 1 %
Eosinophils Absolute: 0.1 10*3/uL (ref 0.0–0.5)
Eosinophils Relative: 2 %
HCT: 41.5 % (ref 39.0–52.0)
Hemoglobin: 13.9 g/dL (ref 13.0–17.0)
Immature Granulocytes: 0 %
Lymphocytes Relative: 44 %
Lymphs Abs: 1.8 10*3/uL (ref 0.7–4.0)
MCH: 29.9 pg (ref 26.0–34.0)
MCHC: 33.5 g/dL (ref 30.0–36.0)
MCV: 89.2 fL (ref 80.0–100.0)
Monocytes Absolute: 0.5 10*3/uL (ref 0.1–1.0)
Monocytes Relative: 11 %
Neutro Abs: 1.7 10*3/uL (ref 1.7–7.7)
Neutrophils Relative %: 42 %
Platelet Count: 119 10*3/uL — ABNORMAL LOW (ref 150–400)
RBC: 4.65 MIL/uL (ref 4.22–5.81)
RDW: 14 % (ref 11.5–15.5)
WBC Count: 4 10*3/uL (ref 4.0–10.5)
nRBC: 0 % (ref 0.0–0.2)

## 2022-02-08 LAB — CMP (CANCER CENTER ONLY)
ALT: 16 U/L (ref 0–44)
AST: 16 U/L (ref 15–41)
Albumin: 4.4 g/dL (ref 3.5–5.0)
Alkaline Phosphatase: 64 U/L (ref 38–126)
Anion gap: 4 — ABNORMAL LOW (ref 5–15)
BUN: 15 mg/dL (ref 6–20)
CO2: 27 mmol/L (ref 22–32)
Calcium: 9.1 mg/dL (ref 8.9–10.3)
Chloride: 106 mmol/L (ref 98–111)
Creatinine: 0.96 mg/dL (ref 0.61–1.24)
GFR, Estimated: >60 mL/min (ref >60)
Glucose, Bld: 99 mg/dL (ref 70–99)
Potassium: 4.1 mmol/L (ref 3.5–5.1)
Sodium: 137 mmol/L (ref 135–145)
Total Bilirubin: 0.4 mg/dL (ref 0.3–1.2)
Total Protein: 7.2 g/dL (ref 6.5–8.1)

## 2022-02-08 MED ORDER — APIXABAN 2.5 MG PO TABS: 2.5000 mg | ORAL_TABLET | Freq: Two times a day (BID) | ORAL | 1 refills | Status: DC

## 2022-02-08 NOTE — Progress Notes (Signed)
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 [place] LE showed occlusive thrombus is seen in the right common femoral and profundus femoral veins as well as at the saphenofemoral junction. Started on Eliquis therapy 5mg  BID.  08/04/2020: establish care with Dr. 10/02/2020  01/17/2021: Transition to maintenance Eliquis therapy 2.5 mg twice daily  Interval History:  Ector Laurel 56 y.o. male with medical history significant for an unprovoked RLE DVT who presents for a follow up visit. The patient's last visit was on 08/10/2021. In the interim since the last visit he is continued on his Eliquis therapy.  On exam today Mr. Chestnut notes he has been well overall in the interim since her last visit.  He reports that since February 2023 he has had no new medications, emergency room visits, and no changes in his health.  He reports that he continues to take his Eliquis 2.5 mg twice daily without any difficulty.  He denies any missed doses.  He is not having any bleeding, or bruising.  He reports the medication he still gets on the discount and it is a "good cost".  He reports that he has been experiencing any issues with leg swelling, leg pain, chest pain, or shortness of breath.  He reports his energy levels are good and his appetite is strong.  He denies any fevers, chills, sweats, nausea, vomiting or diarrhea.  A full 10 point ROS is listed below.  MEDICAL HISTORY:  No past medical history on file.  SURGICAL HISTORY: No past surgical history on file.  SOCIAL HISTORY: Social History   Socioeconomic History   Marital status: Single    Spouse name: Not on file   Number of children: 0   Years of education: Not on file   Highest education level: Not on file  Occupational  History   Not on file  Tobacco Use   Smoking status: Never   Smokeless tobacco: Never  Substance and Sexual Activity   Alcohol use: Never   Drug use: Never   Sexual activity: Not on file  Other Topics Concern   Not on file  Social History Narrative   Not on file   Social Determinants of Health   Financial Resource Strain: Not on file  Food Insecurity: Not on file  Transportation Needs: Not on file  Physical Activity: Not on file  Stress: Not on file  Social Connections: Not on file  Intimate Partner Violence: Not on file    FAMILY HISTORY: Family History  Problem Relation Age of Onset   Healthy Mother    Pulmonary embolism Father     ALLERGIES:  has No Known Allergies.  MEDICATIONS:  Current Outpatient Medications  Medication Sig Dispense Refill   apixaban (ELIQUIS) 2.5 MG TABS tablet Take 1 tablet (2.5 mg total) by mouth 2 (two) times daily. 180 tablet 1   cyanocobalamin 1000 MCG tablet Take 1,000 mcg by mouth daily.     No current facility-administered medications for this visit.    REVIEW OF SYSTEMS:   Constitutional: ( - ) fevers, ( - )  chills , ( - ) night sweats Eyes: ( - ) blurriness of vision, ( - ) double vision, ( - ) watery eyes Ears, nose, mouth, throat, and face: ( - ) mucositis, ( - )  sore throat Respiratory: ( - ) cough, ( - ) dyspnea, ( - ) wheezes Cardiovascular: ( - ) palpitation, ( - ) chest discomfort, ( - ) lower extremity swelling Gastrointestinal:  ( - ) nausea, ( - ) heartburn, ( - ) change in bowel habits Skin: ( - ) abnormal skin rashes Lymphatics: ( - ) new lymphadenopathy, ( - ) easy bruising Neurological: ( - ) numbness, ( - ) tingling, ( - ) new weaknesses Behavioral/Psych: ( - ) mood change, ( - ) new changes  All other systems were reviewed with the patient and are negative.  PHYSICAL EXAMINATION:  Vitals:   02/08/22 1450  BP: 130/84  Pulse: (!) 52  Resp: 16  Temp: 98.3 F (36.8 C)  SpO2: 100%    Filed Weights    02/08/22 1450  Weight: 170 lb 1.6 oz (77.2 kg)     GENERAL: well appearing middle aged [place] American male. alert, no distress and comfortable SKIN: skin color, texture, turgor are normal, no rashes or significant lesions EYES: conjunctiva are pink and non-injected, sclera clear LUNGS: clear to auscultation and percussion with normal breathing effort HEART: regular rate & rhythm and no murmurs and no lower extremity edema Musculoskeletal: no cyanosis of digits and no clubbing  PSYCH: alert & oriented x 3, fluent speech NEURO: no focal motor/sensory deficits  LABORATORY DATA:  I have reviewed the data as listed    Latest Ref Rng & Units 02/08/2022    2:36 PM 08/10/2021    2:12 PM 01/17/2021    3:01 PM  CBC  WBC 4.0 - 10.5 K/uL 4.0  3.5  3.5   Hemoglobin 13.0 - 17.0 g/dL 49.6  75.9  16.3   Hematocrit 39.0 - 52.0 % 41.5  42.0  38.9   Platelets 150 - 400 K/uL 119  118  138        Latest Ref Rng & Units 02/08/2022    2:36 PM 08/10/2021    2:12 PM 01/17/2021    3:01 PM  CMP  Glucose 70 - 99 mg/dL 99  96  92   BUN 6 - 20 mg/dL 15  12  11    Creatinine 0.61 - 1.24 mg/dL  8.46  6.59   Sodium 135 - 145 mmol/L 137  139  140   Potassium 3.5 - 5.1 mmol/L 4.1  4.3  4.2   Chloride 98 - 111 mmol/L 106  107  107   CO2 22 - 32 mmol/L 27  27  26    Calcium 8.9 - 10.3 mg/dL 9.1  8.9  9.4   Total Protein 6.5 - 8.1 g/dL 7.2  6.8  6.7   Total Bilirubin 0.3 - 1.2 mg/dL 0.4  0.5  0.8   Alkaline Phos 38 - 126 U/L 64  72  114   AST 15 - 41 U/L 16  19  22    ALT 0 - 44 U/L 16  22  22      No results found for: "MPROTEIN" No results found for: "KPAFRELGTCHN", "LAMBDASER", "KAPLAMBRATIO"   RADIOGRAPHIC STUDIES: No results found.  ASSESSMENT & PLAN Rutilio Yellowhair 56 y.o. male with no remarkable medical history who presents for evaluation of an unprovoked right lower extremity DVT.   I reviewed labs, reviewed records, schedule the patient the findings most consistent with an unprovoked right  lower extremity DVT.  Given these findings I would recommend that we proceed with indefinite anticoagulation.  He is tolerating Eliquis therapy well and I think  this is a reasonable option for him.  Today we will order labs in order to assure that he is appropriate kidney function and liver function for this therapy.  Additionally we ordered antiphospholipid antibodies (which were negative) in order to assure that he is on the appropriate anticoagulation therapy.  Previously we discussed transitioning to maintenance dose Eliquis.  Maintenance dose consists of Eliquis 2.5 mg twice daily instead of the full strength 5 mg twice daily.  Studies have shown that this is equally efficacious in preventing VTE while decreasing the bleeding risk.  The patient was agreeable to dropping down to this lower dose of maintenance Eliquis after he completes his current bottle of full-strength Eliquis.  We will plan to see him back in 6 months time in order to continue monitoring.  # Unprovoked DVT of Right Leg --continue Eliquis 2.5 mg twice daily. The duration of therapy is indefinite given the unprovoked nature of his DVT. --The patient is completed 6 months of full-strength Eliquis therapy.  Previously we discussed maintenance dosing with 2.5 mg twice daily.  He was agreeable to transitioning to this lower dose and is currently on 2.5 mg twice daily --We will order CBC and CMP at each visit --antiphospholipid antibodies were negative -- If there are financial issues or if the patient does not wish to continue anticoagulation therapy we could consider aspirin 81 mg daily instead. --Labs today show creatinine 0.96, normal LFTs, blood cell count 4.0, hemoglobin 13.9, MCV 89.2, platelets of 119. --Return to clinic in 6 months time in order to reevaluate  No orders of the defined types were placed in this encounter.   All questions were answered. The patient knows to call the clinic with any problems, questions or  concerns.  A total of more than 25 minutes were spent on this encounter and over half of that time was spent on counseling and coordination of care as outlined above.   Ulysees Barns, MD Department of Hematology/Oncology [facility] at [facility] Phone: [phone] Pager: [phone] Email: [email or web address]  02/08/2022 3:26 PM

## 2022-06-20 ENCOUNTER — Ambulatory Visit (INDEPENDENT_AMBULATORY_CARE_PROVIDER_SITE_OTHER): Payer: No Typology Code available for payment source | Admitting: Podiatry

## 2022-06-20 DIAGNOSIS — M216X1 Other acquired deformities of right foot: Secondary | ICD-10-CM | POA: Diagnosis not present

## 2022-06-20 DIAGNOSIS — M2142 Flat foot [pes planus] (acquired), left foot: Secondary | ICD-10-CM | POA: Diagnosis not present

## 2022-06-20 DIAGNOSIS — M2141 Flat foot [pes planus] (acquired), right foot: Secondary | ICD-10-CM

## 2022-06-20 DIAGNOSIS — M216X2 Other acquired deformities of left foot: Secondary | ICD-10-CM

## 2022-06-20 NOTE — Progress Notes (Signed)
  Subjective:  Patient ID: Bobby Herrera, male    DOB: 02/18/1966,  MRN: [id number]  Chief Complaint  Patient presents with   Foot Problem    flat feet and he feels he needs Ortthotics    56 y.o. male presents with concern for bilateral flatfoot deformity.  He is requesting new custom made orthotics.  He says that he walks and runs in them daily.  He has had a previous pair of custom-made orthotics that have been in use for 5+ years.  He says  the top-cover has gotten worn down and needed tape to repair.  No past medical history on file.  Allergies  Allergen Reactions   [place] Rash    ROS: Negative except as per HPI above  Objective:  General: AAO x3, NAD  Dermatological: With inspection and palpation of the right and left lower extremities there are no open sores, no preulcerative lesions, no rash or signs of infection present. Nails are of normal length thickness and coloration.   Vascular:  Dorsalis Pedis artery and Posterior Tibial artery pedal pulses are 2/4 bilateral.  Capillary fill time < 3 sec to all digits.   Neruologic: Grossly intact via light touch bilateral. Protective threshold intact to all sites bilateral.   Musculoskeletal: Bilateral foot with pes planus deformity with loss of medial longitudinal arch height.  No specific areas of pain however he does have a significantly flatfoot deformity with decreased ankle range of motion decreased dorsiflexion available.  Gait: Unassisted, Nonantalgic.   No images are attached to the encounter. Assessment:   1. Pes planus of both feet   2. Acquired equinus deformity of both feet      Plan:  Patient was evaluated and treated and all questions answered.  # Pes planus and equinus deformity of the bilateral foot -Discussed with the patient options for custom-made orthotics as he is interested in the repair. -Patient wishes to proceed with casting for custom-made orthotics and he was casted at this visit. -Also  discussed refabrication of his prior pair of orthotics that he wanted to get the top of the place.  He will consider this at future visit.   Return if symptoms worsen or fail to improve, for after orthotics dispense.          Corinna Gab, DPM [facility] / [facility]

## 2022-08-01 ENCOUNTER — Other Ambulatory Visit: Payer: No Typology Code available for payment source

## 2022-08-07 ENCOUNTER — Ambulatory Visit (INDEPENDENT_AMBULATORY_CARE_PROVIDER_SITE_OTHER): Payer: No Typology Code available for payment source

## 2022-08-07 DIAGNOSIS — M2141 Flat foot [pes planus] (acquired), right foot: Secondary | ICD-10-CM

## 2022-08-07 DIAGNOSIS — M2142 Flat foot [pes planus] (acquired), left foot: Secondary | ICD-10-CM

## 2022-08-07 NOTE — Progress Notes (Signed)
Patient presents today to pick up custom molded foot orthotics recommended by Dr. Loel Lofty.   Orthotics were dispensed and fit was satisfactory. Reviewed instructions for break-in and wear. Written instructions given to patient.  Patient will follow up as needed.   [facility] - order # [id number]

## 2022-08-12 ENCOUNTER — Inpatient Hospital Stay (HOSPITAL_BASED_OUTPATIENT_CLINIC_OR_DEPARTMENT_OTHER): Payer: No Typology Code available for payment source | Admitting: Hematology and Oncology

## 2022-08-12 ENCOUNTER — Other Ambulatory Visit: Payer: Self-pay

## 2022-08-12 ENCOUNTER — Inpatient Hospital Stay: Payer: No Typology Code available for payment source | Attending: Hematology and Oncology

## 2022-08-12 ENCOUNTER — Other Ambulatory Visit: Payer: Self-pay | Admitting: Hematology and Oncology

## 2022-08-12 VITALS — BP 126/79 | HR 59 | Temp 98.4°F | Resp 16 | Wt 174.5 lb

## 2022-08-12 DIAGNOSIS — Z86718 Personal history of other venous thrombosis and embolism: Secondary | ICD-10-CM | POA: Diagnosis present

## 2022-08-12 DIAGNOSIS — I82411 Acute embolism and thrombosis of right femoral vein: Secondary | ICD-10-CM | POA: Diagnosis not present

## 2022-08-12 DIAGNOSIS — Z7901 Long term (current) use of anticoagulants: Secondary | ICD-10-CM | POA: Insufficient documentation

## 2022-08-12 LAB — CBC WITH DIFFERENTIAL (CANCER CENTER ONLY)
Abs Immature Granulocytes: 0 10*3/uL (ref 0.00–0.07)
Basophils Absolute: 0 10*3/uL (ref 0.0–0.1)
Basophils Relative: 1 %
Eosinophils Absolute: 0.1 10*3/uL (ref 0.0–0.5)
Eosinophils Relative: 2 %
HCT: 42.3 % (ref 39.0–52.0)
Hemoglobin: 14.2 g/dL (ref 13.0–17.0)
Immature Granulocytes: 0 %
Lymphocytes Relative: 48 %
Lymphs Abs: 1.9 10*3/uL (ref 0.7–4.0)
MCH: 30.3 pg (ref 26.0–34.0)
MCHC: 33.6 g/dL (ref 30.0–36.0)
MCV: 90.2 fL (ref 80.0–100.0)
Monocytes Absolute: 0.4 10*3/uL (ref 0.1–1.0)
Monocytes Relative: 11 %
Neutro Abs: 1.5 10*3/uL — ABNORMAL LOW (ref 1.7–7.7)
Neutrophils Relative %: 38 %
Platelet Count: 138 10*3/uL — ABNORMAL LOW (ref 150–400)
RBC: 4.69 MIL/uL (ref 4.22–5.81)
RDW: 13.7 % (ref 11.5–15.5)
WBC Count: 3.9 10*3/uL — ABNORMAL LOW (ref 4.0–10.5)
nRBC: 0 % (ref 0.0–0.2)

## 2022-08-12 LAB — CMP (CANCER CENTER ONLY)
ALT: 17 U/L (ref 0–44)
AST: 18 U/L (ref 15–41)
Albumin: 4.2 g/dL (ref 3.5–5.0)
Alkaline Phosphatase: 73 U/L (ref 38–126)
Anion gap: 4 — ABNORMAL LOW (ref 5–15)
BUN: 8 mg/dL (ref 6–20)
CO2: 28 mmol/L (ref 22–32)
Calcium: 9.3 mg/dL (ref 8.9–10.3)
Chloride: 105 mmol/L (ref 98–111)
Creatinine: 0.99 mg/dL (ref 0.61–1.24)
GFR, Estimated: >60 mL/min (ref >60)
Glucose, Bld: 102 mg/dL — ABNORMAL HIGH (ref 70–99)
Potassium: 4.2 mmol/L (ref 3.5–5.1)
Sodium: 137 mmol/L (ref 135–145)
Total Bilirubin: 0.3 mg/dL (ref 0.3–1.2)
Total Protein: 7.2 g/dL (ref 6.5–8.1)

## 2022-08-12 MED ORDER — APIXABAN 2.5 MG PO TABS: 2.5000 mg | ORAL_TABLET | Freq: Two times a day (BID) | ORAL | 1 refills | Status: DC

## 2022-08-12 NOTE — Progress Notes (Signed)
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 [place] LE showed occlusive thrombus is seen in the right common femoral and profundus femoral veins as well as at the saphenofemoral junction. Started on Eliquis therapy 60m BID.  08/04/2020: establish care with Dr. DLorenso Courier 01/17/2021: Transition to maintenance Eliquis therapy 2.5 mg twice daily  Interval History:  Bobby Tweedle89y.o. male with medical history significant for an unprovoked RLE DVT who presents for a follow up visit. The patient's last visit was on 02/08/2022. In the interim since the last visit he is continued on his Eliquis therapy.  On exam today Mr. GBlewittnotes he has been well overall in the last 6 months since his prior visit.  He reports he is tolerating Eliquis 2.5 mg twice daily without any issues.  He is not having any bleeding, bruising, or dark stools.  He notes that the medication only cost about $10 per month.  He is not having any signs or symptoms concerning for recurrent VTE such as chest pain, shortness of breath, leg swelling, or leg pain.  He reports he does have some occasional fatigue.  He notes that he is able to do all of his day-to-day activities without any difficulty.  He denies any fevers, chills, sweats, nausea, vomiting or diarrhea.  A full 10 point ROS is listed below.  SOCIAL HISTORY: Social History   Socioeconomic History   Marital status: Single    Spouse name: Not on file   Number of children: 0   Years of education: Not on file   Highest education level: Not on file  Occupational History   Not on file  Tobacco Use   Smoking status: Never   Smokeless tobacco: Never  Substance and Sexual Activity   Alcohol use: Never   Drug use: Never   Sexual activity: Not  on file  Other Topics Concern   Not on file  Social History Narrative   Not on file   Social Determinants of Health   Financial Resource Strain: Not on file  Food Insecurity: Not on file  Transportation Needs: Not on file  Physical Activity: Not on file  Stress: Not on file  Social Connections: Not on file  Intimate Partner Violence: Not on file    FAMILY HISTORY: Family History  Problem Relation Age of Onset   Healthy Mother    Pulmonary embolism Father     ALLERGIES:  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  MEDICATIONS:  Current Outpatient Medications  Medication Sig Dispense Refill   apixaban (ELIQUIS) 2.5 MG TABS tablet Take 1 tablet (2.5 mg total) by mouth 2 (two) times daily. 180 tablet 1   cyanocobalamin 1000 MCG tablet Take 1,000 mcg by mouth daily.     No current facility-administered medications for this visit.    REVIEW OF SYSTEMS:   Constitutional: ( - ) fevers, ( - )  chills , ( - ) night sweats Eyes: ( - ) blurriness of vision, ( - ) double vision, ( - ) watery eyes Ears, nose, mouth, throat, and face: ( - ) mucositis, ( - ) sore throat Respiratory: ( - ) cough, ( - ) dyspnea, ( - ) wheezes Cardiovascular: ( - ) palpitation, ( - ) chest discomfort, ( - ) lower extremity swelling Gastrointestinal:  ( - )  nausea, ( - ) heartburn, ( - ) change in bowel habits Skin: ( - ) abnormal skin rashes Lymphatics: ( - ) new lymphadenopathy, ( - ) easy bruising Neurological: ( - ) numbness, ( - ) tingling, ( - ) new weaknesses Behavioral/Psych: ( - ) mood change, ( - ) new changes  All other systems were reviewed with the patient and are negative.  PHYSICAL EXAMINATION:  Vitals:   08/12/22 1455  BP: 126/79  Pulse: (!) 59  Resp: 16  Temp: 98.4 F (36.9 C)  SpO2: 100%    Filed Weights   08/12/22 1455  Weight: 174 lb 8 oz (79.2 kg)     GENERAL: well appearing middle aged [place] American male. alert, no distress and comfortable SKIN: skin color, texture, turgor are  normal, no rashes or significant lesions EYES: conjunctiva are pink and non-injected, sclera clear LUNGS: clear to auscultation and percussion with normal breathing effort HEART: regular rate & rhythm and no murmurs and no lower extremity edema Musculoskeletal: no cyanosis of digits and no clubbing  PSYCH: alert & oriented x 3, fluent speech NEURO: no focal motor/sensory deficits  LABORATORY DATA:  I have reviewed the data as listed    Latest Ref Rng & Units 08/12/2022    2:24 PM 02/08/2022    2:36 PM 08/10/2021    2:12 PM  CBC  WBC 4.0 - 10.5 K/uL 3.9  4.0  3.5   Hemoglobin 13.0 - 17.0 g/dL 14.2  13.9  13.8   Hematocrit 39.0 - 52.0 % 42.3  41.5  42.0   Platelets 150 - 400 K/uL 138  119  118        Latest Ref Rng & Units 08/12/2022    2:24 PM 02/08/2022    2:36 PM 08/10/2021    2:12 PM  CMP  Glucose 70 - 99 mg/dL 102  99  96   BUN 6 - 20 mg/dL 8  15  12   $ Creatinine 0.61 - 1.24 mg/dL 0.99  0.96  1.02   Sodium 135 - 145 mmol/L 137  137  139   Potassium 3.5 - 5.1 mmol/L 4.2  4.1  4.3   Chloride 98 - 111 mmol/L 105  106  107   CO2 22 - 32 mmol/L 28  27  27   $ Calcium 8.9 - 10.3 mg/dL 9.3  9.1  8.9   Total Protein 6.5 - 8.1 g/dL 7.2  7.2  6.8   Total Bilirubin 0.3 - 1.2 mg/dL 0.3  0.4  0.5   Alkaline Phos 38 - 126 U/L 73  64  72   AST 15 - 41 U/L 18  16  19   $ ALT 0 - 44 U/L 17  16  22     $ No results found for: "MPROTEIN" No results found for: "KPAFRELGTCHN", "LAMBDASER", "KAPLAMBRATIO"   RADIOGRAPHIC STUDIES: No results found.  ASSESSMENT & PLAN Bobby Herrera 57 y.o. male with no remarkable medical history who presents for evaluation of an unprovoked right lower extremity DVT.   I reviewed labs, reviewed records, schedule the patient the findings most consistent with an unprovoked right lower extremity DVT.  Given these findings I would recommend that we proceed with indefinite anticoagulation.  He is tolerating Eliquis therapy well and I think this is a reasonable option  for him.  Today we will order labs in order to assure that he is appropriate kidney function and liver function for this therapy.  Additionally we ordered antiphospholipid antibodies (which  were negative) in order to assure that he is on the appropriate anticoagulation therapy.  Previously we discussed transitioning to maintenance dose Eliquis.  Maintenance dose consists of Eliquis 2.5 mg twice daily instead of the full strength 5 mg twice daily.  Studies have shown that this is equally efficacious in preventing VTE while decreasing the bleeding risk.  The patient was agreeable to dropping down to this lower dose of maintenance Eliquis after he completes his current bottle of full-strength Eliquis.  We will plan to see him back in 6 months time in order to continue monitoring.  # Unprovoked DVT of Right Leg --continue Eliquis 2.5 mg twice daily. The duration of therapy is indefinite given the unprovoked nature of his DVT. --The patient is completed 6 months of full-strength Eliquis therapy.  Previously we discussed the risks and benefits maintenance dosing with 2.5 mg twice daily.  He was agreeable to transitioning to this lower dose and is currently on 2.5 mg twice daily --We will order CBC and CMP at each visit --antiphospholipid antibodies were negative -- If there are financial issues or if the patient does not wish to continue anticoagulation therapy we could consider aspirin 81 mg daily instead. --Labs today show white blood cell 3.9, hemoglobin 14.2, MCV 90.2, and platelets of 138. --Return to clinic in 6 months time in order to reevaluate  No orders of the defined types were placed in this encounter.   All questions were answered. The patient knows to call the clinic with any problems, questions or concerns.  A total of more than 25 minutes were spent on this encounter and over half of that time was spent on counseling and coordination of care as outlined above.   Ledell Peoples,  MD Department of Hematology/Oncology [facility] Phone: [phone] Pager: [phone] Email: [email or web address]  08/13/2022 9:46 AM

## 2022-12-17 ENCOUNTER — Telehealth: Payer: Self-pay | Admitting: *Deleted

## 2022-12-17 NOTE — Telephone Encounter (Signed)
Notified that he can restart at 2.5 mg BID. He is going to call us when he gets his insurance ~ July 1st so we can send in prescription.

## 2022-12-17 NOTE — Telephone Encounter (Signed)
Mr Timpson states due to not having insurance he has not taken Eliquis since May 30. He will be able to restart July 1st. Wants to know if he will need another starter pack or resume at previous dose?

## 2022-12-30 ENCOUNTER — Other Ambulatory Visit: Payer: Self-pay | Admitting: *Deleted

## 2022-12-30 MED ORDER — APIXABAN 2.5 MG PO TABS: 2.5000 mg | ORAL_TABLET | Freq: Two times a day (BID) | ORAL | 1 refills | Status: DC

## 2023-01-22 ENCOUNTER — Telehealth: Payer: Self-pay | Admitting: Hematology and Oncology

## 2023-01-22 ENCOUNTER — Telehealth: Payer: Self-pay | Admitting: *Deleted

## 2023-01-22 ENCOUNTER — Other Ambulatory Visit: Payer: Self-pay | Admitting: *Deleted

## 2023-01-22 DIAGNOSIS — I82411 Acute embolism and thrombosis of right femoral vein: Secondary | ICD-10-CM

## 2023-01-22 NOTE — Telephone Encounter (Signed)
Received call from pt patient requesting that he have his labs done @ [facility] as it is less expensive there. Advised that I can mail him the requisitions for these labs that he will take to [facility], along with our fax # so the results can be fax'd to this clinic. Pt voiced understanding.  Lab reqs mailed to pt.

## 2023-02-04 ENCOUNTER — Inpatient Hospital Stay: Payer: BC Managed Care – PPO | Attending: Hematology and Oncology | Admitting: Hematology and Oncology

## 2023-02-04 ENCOUNTER — Other Ambulatory Visit: Payer: Self-pay

## 2023-02-04 VITALS — BP 126/83 | HR 48 | Temp 97.4°F | Resp 16 | Wt 166.7 lb

## 2023-02-04 DIAGNOSIS — I82411 Acute embolism and thrombosis of right femoral vein: Secondary | ICD-10-CM

## 2023-02-04 DIAGNOSIS — Z7901 Long term (current) use of anticoagulants: Secondary | ICD-10-CM | POA: Diagnosis not present

## 2023-02-04 DIAGNOSIS — Z86718 Personal history of other venous thrombosis and embolism: Secondary | ICD-10-CM | POA: Diagnosis present

## 2023-02-04 NOTE — Progress Notes (Signed)
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 [place] LE showed occlusive thrombus is seen in the right common femoral and profundus femoral veins as well as at the saphenofemoral junction. Started on Eliquis therapy 5mg  BID.  08/04/2020: establish care with Bobby Herrera  01/17/2021: Transition to maintenance Eliquis therapy 2.5 mg twice daily  Interval History:  Bobby Herrera 57 y.o. male with medical history significant for an unprovoked RLE DVT who presents for a follow up visit. The patient's last visit was on 08/12/2022. In the interim since the last visit he is continued on his Eliquis therapy.  On exam today Bobby Herrera notes he has been doing okay in the interim as her last visit.  There was a brief period of time where he was off of his Eliquis for 1 months time.  It was due to a new insurance.  He notes he is now back on the Eliquis pain $10 per month.  He reports that he is taking Eliquis 2.5 mg twice daily faithfully.  He is not having any bleeding, bruising, or dark stools.  He is not having any signs or symptoms concerning for recurrent VTE such as leg pain, leg swelling, chest pain, or shortness of breath.  He reports that his energy levels are good and his appetite is strong.  He notes that he continues to take no other medications but his vitamin B12.  Overall he is willing and able to proceed with Eliquis therapy at this time.  He denies any fevers, chills, sweats, nausea, vomiting or diarrhea.  A full 10 point ROS is listed below.  SOCIAL HISTORY: Social History   Socioeconomic History   Marital status: Single    Spouse name: Not on file   Number of children: 0   Years of education: Not on file   Highest education level: Not on file  Occupational  History   Not on file  Tobacco Use   Smoking status: Never   Smokeless tobacco: Never  Substance and Sexual Activity   Alcohol use: Never   Drug use: Never   Sexual activity: Not on file  Other Topics Concern   Not on file  Social History Narrative   Not on file   Social Determinants of Health   Financial Resource Strain: Not on file  Food Insecurity: Not on file  Transportation Needs: Not on file  Physical Activity: Not on file  Stress: Not on file  Social Connections: Not on file  Intimate Partner Violence: Not on file    FAMILY HISTORY: Family History  Problem Relation Age of Onset   Healthy Mother    Pulmonary embolism Father     ALLERGIES:  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  MEDICATIONS:  Current Outpatient Medications  Medication Sig Dispense Refill   apixaban (ELIQUIS) 2.5 MG TABS tablet Take 1 tablet (2.5 mg total) by mouth 2 (two) times daily. 180 tablet 1   cyanocobalamin 1000 MCG tablet Take 1,000 mcg by mouth daily.     No current facility-administered medications for this visit.    REVIEW OF SYSTEMS:   Constitutional: ( - ) fevers, ( - )  chills , ( - ) night sweats Eyes: ( - ) blurriness of vision, ( - ) double vision, ( - ) watery eyes Ears, nose, mouth,  throat, and face: ( - ) mucositis, ( - ) sore throat Respiratory: ( - ) cough, ( - ) dyspnea, ( - ) wheezes Cardiovascular: ( - ) palpitation, ( - ) chest discomfort, ( - ) lower extremity swelling Gastrointestinal:  ( - ) nausea, ( - ) heartburn, ( - ) change in bowel habits Skin: ( - ) abnormal skin rashes Lymphatics: ( - ) new lymphadenopathy, ( - ) easy bruising Neurological: ( - ) numbness, ( - ) tingling, ( - ) new weaknesses Behavioral/Psych: ( - ) mood change, ( - ) new changes  All other systems were reviewed with the patient and are negative.  PHYSICAL EXAMINATION:  Vitals:   02/04/23 1437  BP: 126/83  Pulse: (!) 48  Resp: 16  Temp: (!) 97.4 F (36.3 C)  SpO2: 100%     Filed Weights    02/04/23 1437  Weight: 166 lb 11.2 oz (75.6 kg)      GENERAL: well appearing middle aged [place] American male. alert, no distress and comfortable SKIN: skin color, texture, turgor are normal, no rashes or significant lesions EYES: conjunctiva are pink and non-injected, sclera clear LUNGS: clear to auscultation and percussion with normal breathing effort HEART: regular rate & rhythm and no murmurs and no lower extremity edema Musculoskeletal: no cyanosis of digits and no clubbing  PSYCH: alert & oriented x 3, fluent speech NEURO: no focal motor/sensory deficits  LABORATORY DATA:  I have reviewed the data as listed    Latest Ref Rng & Units 08/12/2022    2:24 PM 02/08/2022    2:36 PM 08/10/2021    2:12 PM  CBC  WBC 4.0 - 10.5 K/uL 3.9  4.0  3.5   Hemoglobin 13.0 - 17.0 g/dL 28.4  13.2  44.0   Hematocrit 39.0 - 52.0 % 42.3  41.5  42.0   Platelets 150 - 400 K/uL 138  119  118        Latest Ref Rng & Units 08/12/2022    2:24 PM 02/08/2022    2:36 PM 08/10/2021    2:12 PM  CMP  Glucose 70 - 99 mg/dL 102  99  96   BUN 6 - 20 mg/dL 8  15  12    Creatinine 0.61 - 1.24 mg/dL 7.25  3.66  4.40   Sodium 135 - 145 mmol/L 137  137  139   Potassium 3.5 - 5.1 mmol/L 4.2  4.1  4.3   Chloride 98 - 111 mmol/L 105  106  107   CO2 22 - 32 mmol/L 28  27  27    Calcium 8.9 - 10.3 mg/dL 9.3  9.1  8.9   Total Protein 6.5 - 8.1 g/dL 7.2  7.2  6.8   Total Bilirubin 0.3 - 1.2 mg/dL 0.3  0.4  0.5   Alkaline Phos 38 - 126 U/L 73  64  72   AST 15 - 41 U/L 18  16  19    ALT 0 - 44 U/L 17  16  22      No results found for: "MPROTEIN" No results found for: "KPAFRELGTCHN", "LAMBDASER", "KAPLAMBRATIO"   RADIOGRAPHIC STUDIES: No results found.  ASSESSMENT & PLAN Bobby Herrera 57 y.o. male with no remarkable medical history who presents for evaluation of an unprovoked right lower extremity DVT.   I reviewed labs, reviewed records, schedule the patient the findings most consistent with an unprovoked  right lower extremity DVT.  Given these findings I would recommend that we proceed  with indefinite anticoagulation.  He is tolerating Eliquis therapy well and I think this is a reasonable option for him.  Today we will order labs in order to assure that he is appropriate kidney function and liver function for this therapy.  Additionally we ordered antiphospholipid antibodies (which were negative) in order to assure that he is on the appropriate anticoagulation therapy.  Previously we discussed transitioning to maintenance dose Eliquis.  Maintenance dose consists of Eliquis 2.5 mg twice daily instead of the full strength 5 mg twice daily.  Studies have shown that this is equally efficacious in preventing VTE while decreasing the bleeding risk.  The patient was agreeable to dropping down to this lower dose of maintenance Eliquis after he completes his current bottle of full-strength Eliquis.  We will plan to see him back in 6 months time in order to continue monitoring.  # Unprovoked DVT of Right Leg --The patient is completed 6 months of full-strength Eliquis therapy.  Previously we discussed the risks and benefits maintenance dosing with 2.5 mg twice daily.  He was agreeable to transitioning to this lower dose and is currently on 2.5 mg twice daily --We will order CBC and CMP at each visit --antiphospholipid antibodies were negative -continue Eliquis 2.5 mg twice daily. The duration of therapy is indefinite given the unprovoked nature of his DVT. -- If there are financial issues or if the patient does not wish to continue anticoagulation therapy we could consider aspirin 81 mg daily instead. --Labs today show white blood cell 3.9, hemoglobin 14.0, MCV 90, and platelets of 149.  Creatinine is 0.99 with normal LFTs (these are [facility] labs brought by the patient).  --Return to clinic in 6 months time in order to reevaluate  No orders of the defined types were placed in this encounter.   All questions  were answered. The patient knows to call the clinic with any problems, questions or concerns.  A total of more than 25 minutes were spent on this encounter and over half of that time was spent on counseling and coordination of care as outlined above.   Ulysees Barns, MD Department of Hematology/Oncology [facility] Phone: [phone] Pager: [phone] Email: [email or web address]  02/04/2023 4:07 PM

## 2023-02-10 ENCOUNTER — Ambulatory Visit: Payer: No Typology Code available for payment source | Admitting: Hematology and Oncology

## 2023-02-10 ENCOUNTER — Other Ambulatory Visit: Payer: No Typology Code available for payment source

## 2023-07-11 ENCOUNTER — Other Ambulatory Visit: Payer: Self-pay | Admitting: *Deleted

## 2023-07-11 MED ORDER — APIXABAN 2.5 MG PO TABS: 2.5000 mg | ORAL_TABLET | Freq: Two times a day (BID) | ORAL | 1 refills | Status: AC

## 2023-08-11 ENCOUNTER — Ambulatory Visit: Payer: BC Managed Care – PPO | Admitting: Hematology and Oncology
